# Patient Record
Sex: Female | Born: 1940 | Race: White | Hispanic: No | Marital: Married | Smoking: Never smoker
Health system: Southern US, Community
[De-identification: ages and names within clinical notes are randomized; demographics above are authoritative.]

## PROBLEM LIST (undated history)

## (undated) DIAGNOSIS — Z807 Family history of other malignant neoplasms of lymphoid, hematopoietic and related tissues: Secondary | ICD-10-CM

## (undated) DIAGNOSIS — Z800 Family history of malignant neoplasm of digestive organs: Secondary | ICD-10-CM

## (undated) DIAGNOSIS — Z808 Family history of malignant neoplasm of other organs or systems: Secondary | ICD-10-CM

## (undated) DIAGNOSIS — N6489 Other specified disorders of breast: Secondary | ICD-10-CM

## (undated) DIAGNOSIS — E7800 Pure hypercholesterolemia, unspecified: Secondary | ICD-10-CM

## (undated) DIAGNOSIS — Z803 Family history of malignant neoplasm of breast: Secondary | ICD-10-CM

## (undated) HISTORY — DX: Family history of malignant neoplasm of breast: Z80.3

## (undated) HISTORY — DX: Family history of other malignant neoplasms of lymphoid, hematopoietic and related tissues: Z80.7

## (undated) HISTORY — DX: Pure hypercholesterolemia, unspecified: E78.00

## (undated) HISTORY — PX: APPENDECTOMY: SHX54

## (undated) HISTORY — DX: Family history of malignant neoplasm of other organs or systems: Z80.8

## (undated) HISTORY — DX: Family history of malignant neoplasm of digestive organs: Z80.0

---

## 1997-11-25 ENCOUNTER — Encounter: Payer: Self-pay | Admitting: Gastroenterology

## 1997-11-25 ENCOUNTER — Ambulatory Visit (HOSPITAL_COMMUNITY): Admission: RE | Admit: 1997-11-25 | Discharge: 1997-11-25 | Payer: Self-pay | Admitting: Gastroenterology

## 1998-11-14 ENCOUNTER — Ambulatory Visit (HOSPITAL_COMMUNITY): Admission: RE | Admit: 1998-11-14 | Discharge: 1998-11-14 | Payer: Self-pay | Admitting: Gastroenterology

## 2001-12-07 ENCOUNTER — Other Ambulatory Visit: Admission: RE | Admit: 2001-12-07 | Discharge: 2001-12-07 | Payer: Self-pay | Admitting: Gynecology

## 2003-02-26 HISTORY — PX: COLON SURGERY: SHX602

## 2003-09-14 ENCOUNTER — Ambulatory Visit (HOSPITAL_COMMUNITY): Admission: RE | Admit: 2003-09-14 | Discharge: 2003-09-14 | Payer: Self-pay | Admitting: Gastroenterology

## 2003-10-13 ENCOUNTER — Encounter: Admission: RE | Admit: 2003-10-13 | Discharge: 2003-10-27 | Payer: Self-pay | Admitting: Surgery

## 2003-10-20 ENCOUNTER — Inpatient Hospital Stay (HOSPITAL_COMMUNITY): Admission: RE | Admit: 2003-10-20 | Discharge: 2003-10-27 | Payer: Self-pay | Admitting: Surgery

## 2008-11-24 ENCOUNTER — Ambulatory Visit: Payer: Self-pay | Admitting: Ophthalmology

## 2008-12-07 ENCOUNTER — Ambulatory Visit: Payer: Self-pay | Admitting: Ophthalmology

## 2009-06-20 ENCOUNTER — Ambulatory Visit: Payer: Self-pay | Admitting: General Practice

## 2011-09-02 ENCOUNTER — Ambulatory Visit: Payer: Self-pay | Admitting: Internal Medicine

## 2012-09-02 ENCOUNTER — Ambulatory Visit: Payer: Self-pay | Admitting: Internal Medicine

## 2013-09-27 ENCOUNTER — Ambulatory Visit: Payer: Self-pay | Admitting: Internal Medicine

## 2015-06-14 ENCOUNTER — Other Ambulatory Visit: Payer: Self-pay | Admitting: Internal Medicine

## 2015-06-14 DIAGNOSIS — Z1231 Encounter for screening mammogram for malignant neoplasm of breast: Secondary | ICD-10-CM

## 2015-06-23 ENCOUNTER — Ambulatory Visit
Admission: RE | Admit: 2015-06-23 | Discharge: 2015-06-23 | Disposition: A | Discharge status: Home / Self Care | Payer: Medicare Other | Source: Ambulatory Visit | Attending: Internal Medicine | Admitting: Internal Medicine

## 2015-06-23 ENCOUNTER — Other Ambulatory Visit: Payer: Self-pay | Admitting: Internal Medicine

## 2015-06-23 DIAGNOSIS — Z1231 Encounter for screening mammogram for malignant neoplasm of breast: Secondary | ICD-10-CM | POA: Insufficient documentation

## 2016-06-24 ENCOUNTER — Other Ambulatory Visit: Payer: Self-pay | Admitting: Internal Medicine

## 2016-06-24 DIAGNOSIS — Z1231 Encounter for screening mammogram for malignant neoplasm of breast: Secondary | ICD-10-CM

## 2016-07-09 ENCOUNTER — Ambulatory Visit
Admission: RE | Admit: 2016-07-09 | Discharge: 2016-07-09 | Disposition: A | Discharge status: Home / Self Care | Payer: Medicare Other | Source: Ambulatory Visit | Attending: Internal Medicine | Admitting: Internal Medicine

## 2016-07-09 DIAGNOSIS — Z1231 Encounter for screening mammogram for malignant neoplasm of breast: Secondary | ICD-10-CM | POA: Insufficient documentation

## 2017-06-26 ENCOUNTER — Other Ambulatory Visit: Payer: Self-pay | Admitting: Internal Medicine

## 2017-06-26 DIAGNOSIS — Z1231 Encounter for screening mammogram for malignant neoplasm of breast: Secondary | ICD-10-CM

## 2017-07-23 ENCOUNTER — Ambulatory Visit
Admission: RE | Admit: 2017-07-23 | Discharge: 2017-07-23 | Disposition: A | Discharge status: Home / Self Care | Payer: Medicare Other | Source: Ambulatory Visit | Attending: Internal Medicine | Admitting: Internal Medicine

## 2017-07-23 DIAGNOSIS — Z1231 Encounter for screening mammogram for malignant neoplasm of breast: Secondary | ICD-10-CM | POA: Insufficient documentation

## 2018-09-28 ENCOUNTER — Other Ambulatory Visit: Payer: Self-pay | Admitting: Internal Medicine

## 2018-10-20 ENCOUNTER — Other Ambulatory Visit: Payer: Self-pay | Admitting: Internal Medicine

## 2018-10-20 DIAGNOSIS — Z1231 Encounter for screening mammogram for malignant neoplasm of breast: Secondary | ICD-10-CM

## 2018-10-21 ENCOUNTER — Ambulatory Visit
Admission: RE | Admit: 2018-10-21 | Discharge: 2018-10-21 | Disposition: A | Discharge status: Home / Self Care | Payer: Medicare Other | Source: Ambulatory Visit | Attending: Internal Medicine | Admitting: Internal Medicine

## 2018-10-21 DIAGNOSIS — Z1231 Encounter for screening mammogram for malignant neoplasm of breast: Secondary | ICD-10-CM

## 2018-10-26 ENCOUNTER — Other Ambulatory Visit: Payer: Self-pay | Admitting: Internal Medicine

## 2018-10-26 DIAGNOSIS — R928 Other abnormal and inconclusive findings on diagnostic imaging of breast: Secondary | ICD-10-CM

## 2018-11-05 ENCOUNTER — Ambulatory Visit
Admission: RE | Admit: 2018-11-05 | Discharge: 2018-11-05 | Disposition: A | Discharge status: Home / Self Care | Payer: Medicare Other | Source: Ambulatory Visit | Attending: Internal Medicine | Admitting: Internal Medicine

## 2018-11-05 DIAGNOSIS — R928 Other abnormal and inconclusive findings on diagnostic imaging of breast: Secondary | ICD-10-CM | POA: Insufficient documentation

## 2018-11-06 ENCOUNTER — Other Ambulatory Visit: Payer: Self-pay | Admitting: Internal Medicine

## 2018-11-06 DIAGNOSIS — R928 Other abnormal and inconclusive findings on diagnostic imaging of breast: Secondary | ICD-10-CM

## 2018-11-12 ENCOUNTER — Ambulatory Visit: Payer: Medicare Other

## 2018-11-17 ENCOUNTER — Ambulatory Visit
Admission: RE | Admit: 2018-11-17 | Discharge: 2018-11-17 | Disposition: A | Discharge status: Home / Self Care | Payer: Medicare Other | Source: Ambulatory Visit | Attending: Internal Medicine | Admitting: Internal Medicine

## 2018-11-17 DIAGNOSIS — R928 Other abnormal and inconclusive findings on diagnostic imaging of breast: Secondary | ICD-10-CM | POA: Insufficient documentation

## 2018-11-17 HISTORY — PX: BREAST BIOPSY: SHX20

## 2018-11-18 LAB — SURGICAL PATHOLOGY

## 2018-12-04 ENCOUNTER — Other Ambulatory Visit: Payer: Self-pay | Admitting: General Surgery

## 2018-12-04 DIAGNOSIS — N6489 Other specified disorders of breast: Secondary | ICD-10-CM

## 2018-12-08 ENCOUNTER — Other Ambulatory Visit: Payer: Self-pay | Admitting: General Surgery

## 2018-12-08 DIAGNOSIS — N6489 Other specified disorders of breast: Secondary | ICD-10-CM

## 2018-12-29 ENCOUNTER — Encounter (HOSPITAL_BASED_OUTPATIENT_CLINIC_OR_DEPARTMENT_OTHER): Payer: Self-pay | Admitting: *Deleted

## 2018-12-29 ENCOUNTER — Other Ambulatory Visit: Payer: Self-pay

## 2019-01-01 ENCOUNTER — Other Ambulatory Visit (HOSPITAL_COMMUNITY)
Admission: RE | Admit: 2019-01-01 | Discharge: 2019-01-01 | Disposition: A | Discharge status: Home / Self Care | Payer: Medicare Other | Source: Ambulatory Visit | Attending: General Surgery | Admitting: General Surgery

## 2019-01-01 ENCOUNTER — Other Ambulatory Visit (HOSPITAL_COMMUNITY): Payer: Medicare Other

## 2019-01-01 DIAGNOSIS — Z01812 Encounter for preprocedural laboratory examination: Secondary | ICD-10-CM | POA: Insufficient documentation

## 2019-01-01 DIAGNOSIS — Z20828 Contact with and (suspected) exposure to other viral communicable diseases: Secondary | ICD-10-CM | POA: Insufficient documentation

## 2019-01-01 LAB — SARS CORONAVIRUS 2 (TAT 6-24 HRS): SARS Coronavirus 2: NEGATIVE

## 2019-01-01 NOTE — Progress Notes (Signed)
      Enhanced Recovery after Surgery for Orthopedics Enhanced Recovery after Surgery is a protocol used to improve the stress on your body and your recovery after surgery.  Patient Instructions consume at [place]  . The night before surgery:  o No food after midnight. ONLY clear liquids after midnight  . The day of surgery (if you do NOT have diabetes):  o Drink ONE (1) Pre-Surgery Clear Ensure as directed.   o This drink was given to you during your hospital  pre-op appointment visit. o The pre-op nurse will instruct you on the time to drink the  Pre-Surgery Ensure depending on your surgery time. o Finish the drink at the designated time by the pre-op nurse.  o Nothing else to drink after completing the  Pre-Surgery Clear Ensure.  . The day of surgery (if you have diabetes): o Drink ONE (1) Gatorade 2 (G2) as directed. o This drink was given to you during your hospital  pre-op appointment visit.  o The pre-op nurse will instruct you on the time to drink the   Gatorade 2 (G2) depending on your surgery time. o Color of the Gatorade may vary. Red is not allowed. o Nothing else to drink after completing the  Gatorade 2 (G2).         If you have questions, please contact your surgeon's office.

## 2019-01-04 ENCOUNTER — Other Ambulatory Visit: Payer: Self-pay

## 2019-01-04 ENCOUNTER — Ambulatory Visit
Admission: RE | Admit: 2019-01-04 | Discharge: 2019-01-04 | Disposition: A | Discharge status: Home / Self Care | Payer: Medicare Other | Source: Ambulatory Visit | Attending: General Surgery | Admitting: General Surgery

## 2019-01-04 DIAGNOSIS — N6489 Other specified disorders of breast: Secondary | ICD-10-CM

## 2019-01-04 HISTORY — PX: BREAST LUMPECTOMY: SHX2

## 2019-01-05 ENCOUNTER — Ambulatory Visit (HOSPITAL_BASED_OUTPATIENT_CLINIC_OR_DEPARTMENT_OTHER): Payer: Medicare Other | Admitting: Certified Registered"

## 2019-01-05 ENCOUNTER — Ambulatory Visit
Admission: RE | Admit: 2019-01-05 | Discharge: 2019-01-05 | Disposition: A | Discharge status: Home / Self Care | Payer: Medicare Other | Source: Ambulatory Visit | Attending: General Surgery | Admitting: General Surgery

## 2019-01-05 ENCOUNTER — Ambulatory Visit (HOSPITAL_BASED_OUTPATIENT_CLINIC_OR_DEPARTMENT_OTHER)
Admission: RE | Admit: 2019-01-05 | Discharge: 2019-01-05 | Disposition: A | Discharge status: Home / Self Care | Payer: Medicare Other | Attending: General Surgery | Admitting: General Surgery

## 2019-01-05 ENCOUNTER — Encounter (HOSPITAL_BASED_OUTPATIENT_CLINIC_OR_DEPARTMENT_OTHER): Payer: Self-pay | Admitting: *Deleted

## 2019-01-05 ENCOUNTER — Other Ambulatory Visit: Payer: Self-pay

## 2019-01-05 ENCOUNTER — Encounter (HOSPITAL_BASED_OUTPATIENT_CLINIC_OR_DEPARTMENT_OTHER)
Admission: RE | Disposition: A | Discharge status: Home / Self Care | Payer: Self-pay | Source: Home / Self Care | Attending: General Surgery

## 2019-01-05 DIAGNOSIS — C50411 Malignant neoplasm of upper-outer quadrant of right female breast: Secondary | ICD-10-CM | POA: Diagnosis not present

## 2019-01-05 DIAGNOSIS — Z803 Family history of malignant neoplasm of breast: Secondary | ICD-10-CM | POA: Insufficient documentation

## 2019-01-05 DIAGNOSIS — N6489 Other specified disorders of breast: Secondary | ICD-10-CM

## 2019-01-05 DIAGNOSIS — Z170 Estrogen receptor positive status [ER+]: Secondary | ICD-10-CM | POA: Diagnosis not present

## 2019-01-05 DIAGNOSIS — F419 Anxiety disorder, unspecified: Secondary | ICD-10-CM | POA: Insufficient documentation

## 2019-01-05 DIAGNOSIS — N6310 Unspecified lump in the right breast, unspecified quadrant: Secondary | ICD-10-CM | POA: Diagnosis present

## 2019-01-05 HISTORY — DX: Other specified disorders of breast: N64.89

## 2019-01-05 HISTORY — PX: RADIOACTIVE SEED GUIDED EXCISIONAL BREAST BIOPSY: SHX6490

## 2019-01-05 SURGERY — RADIOACTIVE SEED GUIDED BREAST BIOPSY
Anesthesia: General | Site: Breast | Laterality: Right

## 2019-01-05 MED ORDER — GABAPENTIN 100 MG PO CAPS
100.0000 mg | ORAL_CAPSULE | ORAL | Status: AC
  Administered 2019-01-05: 100 mg via ORAL

## 2019-01-05 MED ORDER — ACETAMINOPHEN 500 MG PO TABS
ORAL_TABLET | ORAL | Status: AC
  Filled 2019-01-05: qty 2

## 2019-01-05 MED ORDER — BUPIVACAINE HCL (PF) 0.25 % IJ SOLN
INTRAMUSCULAR | Status: DC | PRN
  Administered 2019-01-05: 10 mL

## 2019-01-05 MED ORDER — GABAPENTIN 100 MG PO CAPS
ORAL_CAPSULE | ORAL | Status: AC
  Filled 2019-01-05: qty 1

## 2019-01-05 MED ORDER — FENTANYL CITRATE (PF) 100 MCG/2ML IJ SOLN
INTRAMUSCULAR | Status: AC
  Filled 2019-01-05: qty 2

## 2019-01-05 MED ORDER — CEFAZOLIN SODIUM-DEXTROSE 2-4 GM/100ML-% IV SOLN: 2.0000 g | INTRAVENOUS | Status: DC

## 2019-01-05 MED ORDER — ONDANSETRON HCL 4 MG/2ML IJ SOLN
INTRAMUSCULAR | Status: AC
  Filled 2019-01-05: qty 2

## 2019-01-05 MED ORDER — FENTANYL CITRATE (PF) 100 MCG/2ML IJ SOLN
50.0000 ug | INTRAMUSCULAR | Status: DC | PRN
  Administered 2019-01-05: 50 ug via INTRAVENOUS

## 2019-01-05 MED ORDER — DEXAMETHASONE SODIUM PHOSPHATE 10 MG/ML IJ SOLN
INTRAMUSCULAR | Status: AC
  Filled 2019-01-05: qty 1

## 2019-01-05 MED ORDER — ACETAMINOPHEN-CODEINE #3 300-30 MG PO TABS: 1.0000 | ORAL_TABLET | Freq: Four times a day (QID) | ORAL | 0 refills | Status: DC | PRN

## 2019-01-05 MED ORDER — ENSURE PRE-SURGERY PO LIQD: 296.0000 mL | Freq: Once | ORAL | Status: DC

## 2019-01-05 MED ORDER — MIDAZOLAM HCL 2 MG/2ML IJ SOLN: 1.0000 mg | INTRAMUSCULAR | Status: DC | PRN

## 2019-01-05 MED ORDER — ONDANSETRON HCL 4 MG/2ML IJ SOLN
INTRAMUSCULAR | Status: DC | PRN
  Administered 2019-01-05: 4 mg via INTRAVENOUS

## 2019-01-05 MED ORDER — LIDOCAINE 2% (20 MG/ML) 5 ML SYRINGE
INTRAMUSCULAR | Status: AC
  Filled 2019-01-05: qty 5

## 2019-01-05 MED ORDER — FENTANYL CITRATE (PF) 100 MCG/2ML IJ SOLN
25.0000 ug | INTRAMUSCULAR | Status: DC | PRN
  Administered 2019-01-05: 25 ug via INTRAVENOUS

## 2019-01-05 MED ORDER — ACETAMINOPHEN 500 MG PO TABS
1000.0000 mg | ORAL_TABLET | ORAL | Status: AC
  Administered 2019-01-05: 1000 mg via ORAL

## 2019-01-05 MED ORDER — PROPOFOL 10 MG/ML IV BOLUS
INTRAVENOUS | Status: DC | PRN
  Administered 2019-01-05: 60 mg via INTRAVENOUS

## 2019-01-05 MED ORDER — LACTATED RINGERS IV SOLN
INTRAVENOUS | Status: DC
  Administered 2019-01-05: 10:00:00 via INTRAVENOUS

## 2019-01-05 MED ORDER — CEFAZOLIN SODIUM-DEXTROSE 2-3 GM-%(50ML) IV SOLR
INTRAVENOUS | Status: DC | PRN
  Administered 2019-01-05: 2 g via INTRAVENOUS

## 2019-01-05 MED ORDER — CEFAZOLIN SODIUM-DEXTROSE 2-4 GM/100ML-% IV SOLN
INTRAVENOUS | Status: AC
  Filled 2019-01-05: qty 100

## 2019-01-05 MED ORDER — DEXAMETHASONE SODIUM PHOSPHATE 10 MG/ML IJ SOLN
INTRAMUSCULAR | Status: DC | PRN
  Administered 2019-01-05: 5 mg via INTRAVENOUS

## 2019-01-05 MED ORDER — PROPOFOL 10 MG/ML IV BOLUS
INTRAVENOUS | Status: AC
  Filled 2019-01-05: qty 20

## 2019-01-05 SURGICAL SUPPLY — 59 items
ADH SKN CLS APL DERMABOND .7 (GAUZE/BANDAGES/DRESSINGS) ×1
APL PRP STRL LF DISP 70% ISPRP (MISCELLANEOUS) ×1
APPLIER CLIP 9.375 MED OPEN (MISCELLANEOUS)
APR CLP MED 9.3 20 MLT OPN (MISCELLANEOUS)
BINDER BREAST MEDIUM (GAUZE/BANDAGES/DRESSINGS) ×2 IMPLANT
BLADE SURG 15 STRL LF DISP TIS (BLADE) ×1 IMPLANT
BLADE SURG 15 STRL SS (BLADE) ×3
CANISTER SUC SOCK COL 7IN (MISCELLANEOUS) IMPLANT
CANISTER SUCT 1200ML W/VALVE (MISCELLANEOUS) IMPLANT
CHLORAPREP W/TINT 26 (MISCELLANEOUS) ×3 IMPLANT
CLIP APPLIE 9.375 MED OPEN (MISCELLANEOUS) IMPLANT
CLIP VESOCCLUDE SM WIDE 6/CT (CLIP) IMPLANT
CLOSURE WOUND 1/2 X4 (GAUZE/BANDAGES/DRESSINGS) ×1
COVER BACK TABLE REUSABLE LG (DRAPES) ×3 IMPLANT
COVER MAYO STAND REUSABLE (DRAPES) ×3 IMPLANT
COVER PROBE W GEL 5X96 (DRAPES) ×3 IMPLANT
COVER WAND RF STERILE (DRAPES) IMPLANT
DECANTER SPIKE VIAL GLASS SM (MISCELLANEOUS) IMPLANT
DERMABOND ADVANCED (GAUZE/BANDAGES/DRESSINGS) ×2
DERMABOND ADVANCED .7 DNX12 (GAUZE/BANDAGES/DRESSINGS) ×1 IMPLANT
DRAPE LAPAROSCOPIC ABDOMINAL (DRAPES) ×3 IMPLANT
DRAPE UTILITY XL STRL (DRAPES) ×3 IMPLANT
ELECT COATED BLADE 2.86 ST (ELECTRODE) ×3 IMPLANT
ELECT REM PT RETURN 9FT ADLT (ELECTROSURGICAL) ×3
ELECTRODE REM PT RTRN 9FT ADLT (ELECTROSURGICAL) ×1 IMPLANT
GAUZE SPONGE 4X4 12PLY STRL LF (GAUZE/BANDAGES/DRESSINGS) IMPLANT
GLOVE BIO SURGEON STRL SZ7 (GLOVE) ×6 IMPLANT
GLOVE BIOGEL PI IND STRL 6.5 (GLOVE) IMPLANT
GLOVE BIOGEL PI IND STRL 7.5 (GLOVE) ×1 IMPLANT
GLOVE BIOGEL PI INDICATOR 6.5 (GLOVE) ×2
GLOVE BIOGEL PI INDICATOR 7.5 (GLOVE) ×2
GLOVE ECLIPSE 6.5 STRL STRAW (GLOVE) ×2 IMPLANT
GOWN STRL REUS W/ TWL LRG LVL3 (GOWN DISPOSABLE) ×2 IMPLANT
GOWN STRL REUS W/TWL LRG LVL3 (GOWN DISPOSABLE) ×6
HEMOSTAT ARISTA ABSORB 3G PWDR (HEMOSTASIS) IMPLANT
ILLUMINATOR WAVEGUIDE N/F (MISCELLANEOUS) IMPLANT
KIT MARKER MARGIN INK (KITS) ×3 IMPLANT
LIGHT WAVEGUIDE WIDE FLAT (MISCELLANEOUS) IMPLANT
NDL HYPO 25X1 1.5 SAFETY (NEEDLE) ×1 IMPLANT
NEEDLE HYPO 25X1 1.5 SAFETY (NEEDLE) ×3 IMPLANT
NS IRRIG 1000ML POUR BTL (IV SOLUTION) ×2 IMPLANT
PACK BASIN DAY SURGERY FS (CUSTOM PROCEDURE TRAY) ×3 IMPLANT
PENCIL SMOKE EVACUATOR (MISCELLANEOUS) ×3 IMPLANT
SLEEVE SCD COMPRESS KNEE MED (MISCELLANEOUS) ×3 IMPLANT
SPONGE LAP 4X18 RFD (DISPOSABLE) ×3 IMPLANT
STRIP CLOSURE SKIN 1/2X4 (GAUZE/BANDAGES/DRESSINGS) ×2 IMPLANT
SUT MNCRL AB 4-0 PS2 18 (SUTURE) IMPLANT
SUT MON AB 5-0 PS2 18 (SUTURE) ×2 IMPLANT
SUT SILK 2 0 SH (SUTURE) IMPLANT
SUT VIC AB 2-0 SH 27 (SUTURE) ×3
SUT VIC AB 2-0 SH 27XBRD (SUTURE) ×1 IMPLANT
SUT VIC AB 3-0 SH 27 (SUTURE) ×3
SUT VIC AB 3-0 SH 27X BRD (SUTURE) ×1 IMPLANT
SYR CONTROL 10ML LL (SYRINGE) ×3 IMPLANT
TOWEL GREEN STERILE FF (TOWEL DISPOSABLE) ×3 IMPLANT
TRAY FAXITRON CT DISP (TRAY / TRAY PROCEDURE) ×3 IMPLANT
TUBE CONNECTING 20'X1/4 (TUBING)
TUBE CONNECTING 20X1/4 (TUBING) IMPLANT
YANKAUER SUCT BULB TIP NO VENT (SUCTIONS) IMPLANT

## 2019-01-05 NOTE — Anesthesia Preprocedure Evaluation (Addendum)
Anesthesia Evaluation  Patient identified by MRN, date of birth, ID band Patient awake    Reviewed: Allergy & Precautions, NPO status , Patient's Chart, lab work & pertinent test results  Airway Mallampati: I  TM Distance: >3 FB Neck ROM: Full    Dental no notable dental hx. (+) Teeth Intact, Dental Advisory Given   Pulmonary neg pulmonary ROS,    Pulmonary exam normal breath sounds clear to auscultation       Cardiovascular negative cardio ROS Normal cardiovascular exam Rhythm:Regular Rate:Normal     Neuro/Psych PSYCHIATRIC DISORDERS Anxiety negative neurological ROS     GI/Hepatic negative GI ROS, Neg liver ROS,   Endo/Other  negative endocrine ROS  Renal/GU negative Renal ROS  negative genitourinary   Musculoskeletal negative musculoskeletal ROS (+)   Abdominal   Peds  Hematology negative hematology ROS (+)   Anesthesia Other Findings   Reproductive/Obstetrics                            Anesthesia Physical Anesthesia Plan  ASA: II  Anesthesia Plan: General   Post-op Pain Management:    Induction: Intravenous  PONV Risk Score and Plan: 3 and Ondansetron, Dexamethasone and Treatment may vary due to age or medical condition  Airway Management Planned: LMA  Additional Equipment:   Intra-op Plan:   Post-operative Plan: Extubation in OR  Informed Consent: I have reviewed the patients History and Physical, chart, labs and discussed the procedure including the risks, benefits and alternatives for the proposed anesthesia with the patient or authorized representative who has indicated his/her understanding and acceptance.     Dental advisory given  Plan Discussed with: CRNA  Anesthesia Plan Comments:         Anesthesia Quick Evaluation

## 2019-01-05 NOTE — Transfer of Care (Signed)
Immediate Anesthesia Transfer of Care Note  Patient: Sheri Shaw  Procedure(s) Performed: RADIOACTIVE SEED GUIDED EXCISIONAL RIGHT BREAST BIOPSY (Right Breast)  Patient Location: PACU  Anesthesia Type:General  Level of Consciousness: sedated  Airway & Oxygen Therapy: Patient Spontanous Breathing and Patient connected to face mask oxygen  Post-op Assessment: Report given to RN and Post -op Vital signs reviewed and stable  Post vital signs: Reviewed and stable  Last Vitals:  Vitals Value Taken Time  BP 165/71 01/05/19 1110  Temp    Pulse 61 01/05/19 1112  Resp 14 01/05/19 1112  SpO2 98 % 01/05/19 1112  Vitals shown include unvalidated device data.  Last Pain:  Vitals:   01/05/19 1004  TempSrc: Oral  PainSc: 0-No pain      Patients Stated Pain Goal: 3 ([id number])  Complications: No apparent anesthesia complications

## 2019-01-05 NOTE — H&P (Signed)
78 yof referred by Dr Owens Shark for new right breast mass. she has no prior breast history. fh of an aunt with breast cancer in her 20s. she had a screening mm that shows c density breasts. she had a ruoq distortion. there was no sonographic correlate. this underwent a biopsy that showed a small csl. the clip migrated 1.8 cm away. she is here to discuss options. her son is Dr Barnett Applebaum in [place]   Past Surgical History Appendectomy  Breast Biopsy  Right. Cataract Surgery  Bilateral. Colon Polyp Removal - Colonoscopy  Colon Removal - Partial   Diagnostic Studies History Colonoscopy  >10 years ago Mammogram  within last year Pap Smear  >5 years ago  Allergies  No Known Allergies No Known Drug Allergies Allergies Reconciled   Medication History  diazePAM (5MG  Tablet, Oral) Active. Vital-D Rx (1MG  Tablet, Oral) Active. Medications Reconciled  Social History Caffeine use  Tea. No alcohol use  No drug use  Tobacco use  Never smoker.  Family History  Breast Cancer  Family Members In General.  Pregnancy / Birth History  Age at menarche  17 years. Age of menopause  52-55 Contraceptive History  Oral contraceptives. Gravida  2 Maternal age  64-25 Para  2  Other Problems  Gastric Ulcer  Hypercholesterolemia    Review of Systems General Not Present- Appetite Loss, Chills, Fatigue, Fever, Night Sweats, Weight Gain and Weight Loss. Skin Not Present- Change in Wart/Mole, Dryness, Hives, Jaundice, New Lesions, Non-Healing Wounds, Rash and Ulcer. HEENT Not Present- Earache, Hearing Loss, Hoarseness, Nose Bleed, Oral Ulcers, Ringing in the Ears, Seasonal Allergies, Sinus Pain, Sore Throat, Visual Disturbances, Wears glasses/contact lenses and Yellow Eyes. Respiratory Not Present- Bloody sputum, Chronic Cough, Difficulty Breathing, Snoring and Wheezing. Breast Not Present- Breast Mass, Breast Pain, Nipple Discharge and Skin Changes. Cardiovascular  Not Present- Chest Pain, Difficulty Breathing Lying Down, Leg Cramps, Palpitations, Rapid Heart Rate, Shortness of Breath and Swelling of Extremities. Gastrointestinal Not Present- Abdominal Pain, Bloating, Bloody Stool, Change in Bowel Habits, Chronic diarrhea, Constipation, Difficulty Swallowing, Excessive gas, Gets full quickly at meals, Hemorrhoids, Indigestion, Nausea, Rectal Pain and Vomiting. Female Genitourinary Not Present- Frequency, Nocturia, Painful Urination, Pelvic Pain and Urgency. Musculoskeletal Not Present- Back Pain, Joint Pain, Joint Stiffness, Muscle Pain, Muscle Weakness and Swelling of Extremities. Neurological Not Present- Decreased Memory, Fainting, Headaches, Numbness, Seizures, Tingling, Tremor, Trouble walking and Weakness. Psychiatric Not Present- Anxiety, Bipolar, Change in Sleep Pattern, Depression, Fearful and Frequent crying. Endocrine Not Present- Cold Intolerance, Excessive Hunger, Hair Changes, Heat Intolerance, Hot flashes and New Diabetes. Hematology Not Present- Blood Thinners, Easy Bruising, Excessive bleeding, Gland problems, HIV and Persistent Infections.   Physical Exam  General Mental Status-Alert. Orientation-Oriented X3. Head and Neck Trachea-midline. Eye Sclera/Conjunctiva - Bilateral-No scleral icterus. Chest and Lung Exam Chest and lung exam reveals -quiet, even and easy respiratory effort with no use of accessory muscles. Breast Nipples-No Discharge. Breast Lump-No Palpable Breast Mass. Note: hematoma upper outer quadrant right breast Neurologic Neurologic evaluation reveals -alert and oriented x 3 with no impairment of recent or remote memory. Lymphatic Head & Neck General Head & Neck Lymphatics: Bilateral - Description - Normal. Axillary General Axillary Region: Bilateral - Description - Normal. Note: no Trooper adenopathy   Assessment & Plan  ABNORMAL MAMMOGRAM OF RIGHT BREAST (R92.8) Story: Right breast seed guided  excisional biopsy We discussed observation vs surgery. discussed low change of upgrade on surgical excision. discussed seed guided excision with risks, recovery. she would like to  proceed with surgery. will schedule at least a couple weeks out to let her hematoma resolve Local data suggests up to 18% upgrade rate to atypia or dcis with excision. discussed distortion is hard to follow for changes as well

## 2019-01-05 NOTE — Discharge Instructions (Signed)
[facility] °Office Phone Number [phone] °POST OP INSTRUCTIONS °Take 400 mg of ibuprofen every 8 hours or 650 mg tylenol every 6 hours for next 72 hours then as needed. Use ice several times daily also. °Always review your discharge instruction sheet given to you by the facility where your surgery was performed. ° °IF YOU HAVE DISABILITY OR FAMILY LEAVE FORMS, YOU MUST BRING THEM TO THE OFFICE FOR PROCESSING.  DO NOT GIVE THEM TO YOUR DOCTOR. ° °1. A prescription for pain medication may be given to you upon discharge.  Take your pain medication as prescribed, if needed.  If narcotic pain medicine is not needed, then you may take acetaminophen (Tylenol), naprosyn (Alleve) or ibuprofen (Advil) as needed. °2. Take your usually prescribed medications unless otherwise directed °3. If you need a refill on your pain medication, please contact your pharmacy.  They will contact our office to request authorization.  Prescriptions will not be filled after 5pm or on week-ends. °4. You should eat very light the first 24 hours after surgery, such as soup, crackers, pudding, etc.  Resume your normal diet the day after surgery. °5. Most patients will experience some swelling and bruising in the breast.  Ice packs and a good support bra will help.  Wear the breast binder provided or a sports bra for 72 hours day and night.  After that wear a sports bra during the day until you return to the office. Swelling and bruising can take several days to resolve.  °6. It is common to experience some constipation if taking pain medication after surgery.  Increasing fluid intake and taking a stool softener will usually help or prevent this problem from occurring.  A mild laxative (Milk of Magnesia or Miralax) should be taken according to package directions if there are no bowel movements after 48 hours. °7. Unless discharge instructions indicate otherwise, you may remove your bandages 48 hours after surgery and you may  shower at that time.  You may have steri-strips (small skin tapes) in place directly over the incision.  These strips should be left on the skin for 7-10 days and will come off on their own.  If your surgeon used skin glue on the incision, you may shower in 24 hours.  The glue will flake off over the next 2-3 weeks.  Any sutures or staples will be removed at the office during your follow-up visit. °8. ACTIVITIES:  You may resume regular daily activities (gradually increasing) beginning the next day.  Wearing a good support bra or sports bra minimizes pain and swelling.  You may have sexual intercourse when it is comfortable. °a. You may drive when you no longer are taking prescription pain medication, you can comfortably wear a seatbelt, and you can safely maneuver your car and apply brakes. °b. RETURN TO WORK:  ______________________________________________________________________________________ °9. You should see your doctor in the office for a follow-up appointment approximately two weeks after your surgery.  Your doctor’s nurse will typically make your follow-up appointment when she calls you with your pathology report.  Expect your pathology report 3-4 business days after your surgery.  You may call to check if you do not hear from us after three days. °10. OTHER INSTRUCTIONS: _______________________________________________________________________________________________ _____________________________________________________________________________________________________________________________________ °_____________________________________________________________________________________________________________________________________ °_____________________________________________________________________________________________________________________________________ ° °WHEN TO CALL DR WAKEFIELD: °1. Fever over 101.0 °2. Nausea and/or vomiting. °3. Extreme swelling or bruising. °4. Continued bleeding from  incision. °5. Increased pain, redness, or drainage from the incision. ° °The clinic staff is available to answer   your questions during regular business hours.  Please don’t hesitate to call and ask to speak to one of the nurses for clinical concerns.  If you have a medical emergency, go to the nearest emergency room or call 911.  A surgeon from [facility] is always on call at the hospital. ° °For further questions, please visit [email or web address] ° ° ° °Post Anesthesia Home Care Instructions ° °Activity: °Get plenty of rest for the remainder of the day. A responsible individual must stay with you for 24 hours following the procedure.  °For the next 24 hours, DO NOT: °-Drive a car °-Operate machinery °-Drink alcoholic beverages °-Take any medication unless instructed by your physician °-Make any legal decisions or sign important papers. ° °Meals: °Start with liquid foods such as gelatin or soup. Progress to regular foods as tolerated. Avoid greasy, spicy, heavy foods. If nausea and/or vomiting occur, drink only clear liquids until the nausea and/or vomiting subsides. Call your physician if vomiting continues. ° °Special Instructions/Symptoms: °Your throat may feel dry or sore from the anesthesia or the breathing tube placed in your throat during surgery. If this causes discomfort, gargle with warm salt water. The discomfort should disappear within 24 hours. ° °If you had a scopolamine patch placed behind your ear for the management of post- operative nausea and/or vomiting: ° °1. The medication in the patch is effective for 72 hours, after which it should be removed.  Wrap patch in a tissue and discard in the trash. Wash hands thoroughly with soap and water. °2. You may remove the patch earlier than 72 hours if you experience unpleasant side effects which may include dry mouth, dizziness or visual disturbances. °3. Avoid touching the patch. Wash your hands with soap and water after contact  with the patch. ° ° °Call your surgeon if you experience:  ° °1.  Fever over 101.0. °2.  Inability to urinate. °3.  Nausea and/or vomiting. °4.  Extreme swelling or bruising at the surgical site. °5.  Continued bleeding from the incision. °6.  Increased pain, redness or drainage from the incision. °7.  Problems related to your pain medication. °8.  Any problems and/or concerns °   ° ° °

## 2019-01-05 NOTE — Anesthesia Postprocedure Evaluation (Signed)
Anesthesia Post Note  Patient: Sheri Shaw  Procedure(s) Performed: RADIOACTIVE SEED GUIDED EXCISIONAL RIGHT BREAST BIOPSY (Right Breast)     Patient location during evaluation: PACU Anesthesia Type: General Level of consciousness: awake and alert Pain management: pain level controlled Vital Signs Assessment: post-procedure vital signs reviewed and stable Respiratory status: spontaneous breathing, nonlabored ventilation, respiratory function stable and patient connected to nasal cannula oxygen Cardiovascular status: blood pressure returned to baseline and stable Postop Assessment: no apparent nausea or vomiting Anesthetic complications: no    Last Vitals:  Vitals:   01/05/19 1216 01/05/19 1320  BP:  (!) 170/65  Pulse: (!) 58 63  Resp: 19 20  Temp:  36.6 C  SpO2: 100% 100%    Last Pain:  Vitals:   01/05/19 1320  TempSrc: Oral  PainSc: 3                  Sheri Shaw Sheri Shaw

## 2019-01-05 NOTE — Anesthesia Procedure Notes (Signed)
Procedure Name: LMA Insertion Performed by: Verita Lamb, CRNA Pre-anesthesia Checklist: Patient identified, Emergency Drugs available, Suction available and Patient being monitored Patient Re-evaluated:Patient Re-evaluated prior to induction Oxygen Delivery Method: Circle system utilized Preoxygenation: Pre-oxygenation with 100% oxygen Induction Type: IV induction Ventilation: Mask ventilation without difficulty LMA: LMA inserted LMA Size: 3.0 Number of attempts: 1 Placement Confirmation: positive ETCO2 Tube secured with: Tape Dental Injury: Teeth and Oropharynx as per pre-operative assessment

## 2019-01-05 NOTE — Op Note (Signed)
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 Serita Grammes Anesthesia: General Complications: None Drains: None Specimens: Right breast tissue marked with paint Sponge and count was correct at completion Disposition to recovery in stable condition  Indications: This is 78 year old female who underwent a screening mammogram that showed a right upper outer quadrant distortion.  There was no sonographic correlate.  This underwent a biopsy that showed a complex sclerosing lesion.  She was referred to discuss options and she would like this area removed.  She had a seed placed and I discussed with the radiologist because the clip had migrated 1.8 cm away.  The seed was in the posterior portion of the distortion.  Procedure: After informed consent was obtained the patient was taken to the operating room.  She was given antibiotics.  SCDs were in place.  She was placed under general anesthesia without complication.  She was prepped and draped in the standard sterile surgical fashion.  A surgical timeout was then performed.  I located the seed in the right upper outer quadrant.  I then infiltrated Marcaine and made a periareolar incision in order to hide the scar later.  I then tunneled to the radioactive seed.  I remove the tissue anterior to the radioactive seed and the seed was at the posterior margin of the excision.  I then confirmed the seed removal with mammography.  The clip was not present but this was not unexpected given its migration.  I then obtained hemostasis.  I closed the breast tissue with 2-0 Vicryl.  The skin was closed with 3-0 Vicryl and 5-0 Monocryl.  Glue and Steri-Strips were applied.  She tolerated this well was extubated and transferred to recovery stable.

## 2019-01-06 ENCOUNTER — Encounter (HOSPITAL_BASED_OUTPATIENT_CLINIC_OR_DEPARTMENT_OTHER): Payer: Self-pay | Admitting: General Surgery

## 2019-01-08 LAB — SURGICAL PATHOLOGY

## 2019-01-20 ENCOUNTER — Other Ambulatory Visit: Payer: Self-pay

## 2019-01-25 ENCOUNTER — Encounter: Payer: Self-pay | Admitting: Oncology

## 2019-01-25 ENCOUNTER — Inpatient Hospital Stay: Payer: Medicare Other | Attending: Oncology | Admitting: Oncology

## 2019-01-25 ENCOUNTER — Other Ambulatory Visit: Payer: Self-pay

## 2019-01-25 VITALS — BP 169/87 | HR 77 | Temp 98.3°F | Resp 18 | Ht 67.32 in | Wt 114.4 lb

## 2019-01-25 DIAGNOSIS — C50411 Malignant neoplasm of upper-outer quadrant of right female breast: Secondary | ICD-10-CM | POA: Diagnosis not present

## 2019-01-25 DIAGNOSIS — Z170 Estrogen receptor positive status [ER+]: Secondary | ICD-10-CM | POA: Diagnosis not present

## 2019-01-25 NOTE — Progress Notes (Signed)
New patient evaluation today.

## 2019-01-26 NOTE — Progress Notes (Signed)
Hematology/Oncology Consult note [facility] Telephone:([phone] [phone]   Patient Care Team: Adin Hector, MD as PCP - General (Internal Medicine)  REFERRING PROVIDER: Rolm Bookbinder, MD  CHIEF COMPLAINTS/REASON FOR VISIT:  Evaluation of breast cancer   HISTORY OF PRESENTING ILLNESS:   Sheri Shaw is a  78 y.o.  female with PMH listed below was seen in consultation at the request of  Rolm Bookbinder, MD  for evaluation of breast cancer.  10/21/2018 bilateral screening mammogram showed right breast architectural distortion at approximately 11:00.  No sonographic correlate identified.  11/05/2018 unilateral right diagnostic mammogram confirmed right breast architectural distortion.Targeted ultrasound is performed in the upper outer right breast, showing no discrete cystic or solid mass or area of shadowing to correspond to the mammographic finding of architectural distortion.  Right axillary lymph node status was not commented. 11/17/2018 patient underwent stereotactic core needle biopsy of the right breast distortion.    Biopsy showed small complex sclerosing lesion/radial scar.  Fibrocystic changes and few ductal microcalcifications.  Focal duct ectasia and inflammation.  Negative for atypia or malignancy. Patient was evaluated by Dr. 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  DCIS is present, low-grade.  Calcifications associated with carcinoma.  Margins were uninvolved by carcinoma. Lymphovascular space invasion present. Previous biopsy site changes present. pT1a, pNX ER/PR strongly positive.  95%. Ki-67 5% HER-2 negative  (1+) Patient was referred to oncology for further evaluation and management. Today the patient was accompanied by his son Dr. Barnett Applebaum who is an OB/GYN physician at [facility]  center Patient reports having bruising/soreness around her surgical site.  Denies any other complaints.   Denies any previous use of estrogen replacement therapy. Her maternal aunt had a history of breast cancer.  Maternal uncle history of lung cancer. Patient lives with her husband.    Patient has a history of osteoporosis.  No recent DEXA scan.  Per patient, she previously cannot tolerate Fosamax treatment.  Review of Systems  Constitutional: Negative for appetite change, chills, fatigue and fever.  HENT:   Negative for hearing loss and voice change.   Eyes: Negative for eye problems.  Respiratory: Negative for chest tightness and cough.   Cardiovascular: Negative for chest pain.  Gastrointestinal: Negative for abdominal distention, abdominal pain and blood in stool.  Endocrine: Negative for hot flashes.  Genitourinary: Negative for difficulty urinating and frequency.   Musculoskeletal: Negative for arthralgias.  Skin: Negative for itching and rash.  Neurological: Negative for extremity weakness.  Hematological: Negative for adenopathy.  Psychiatric/Behavioral: Negative for confusion.    MEDICAL HISTORY:  Past Medical History:  Diagnosis Date  . Hypercholesteremia   . Radial scar of right breast     SURGICAL HISTORY: Past Surgical History:  Procedure Laterality Date  . APPENDECTOMY    . BREAST BIOPSY Right 11/17/2018   affirm bx  ribbon marker, path pending  . COLON SURGERY     for diverticulitis  . RADIOACTIVE SEED GUIDED EXCISIONAL BREAST BIOPSY Right 01/05/2019   Procedure: RADIOACTIVE SEED GUIDED EXCISIONAL RIGHT BREAST BIOPSY;  Surgeon: Rolm Bookbinder, MD;  Location: [facility];  Service: General;  Laterality: Right;    SOCIAL HISTORY: Social History   Socioeconomic History  . Marital status: Married    Spouse name: Not on file  . Number of children: Not on file  . Years of education: Not on file  .  Highest education level: Not on file   Occupational History  . Not on file  Social Needs  . Financial resource strain: Not on file  . Food insecurity    Worry: Not on file    Inability: Not on file  . Transportation needs    Medical: Not on file    Non-medical: Not on file  Tobacco Use  . Smoking status: Never Smoker  . Smokeless tobacco: Never Used  Substance and Sexual Activity  . Alcohol use: Never    Frequency: Never  . Drug use: Never  . Sexual activity: Not on file  Lifestyle  . Physical activity    Days per week: Not on file    Minutes per session: Not on file  . Stress: Not on file  Relationships  . Social Herbalist on phone: Not on file    Gets together: Not on file    Attends religious service: Not on file    Active member of club or organization: Not on file    Attends meetings of clubs or organizations: Not on file    Relationship status: Not on file  . Intimate partner violence    Fear of current or ex partner: Not on file    Emotionally abused: Not on file    Physically abused: Not on file    Forced sexual activity: Not on file  Other Topics Concern  . Not on file  Social History Narrative  . Not on file    FAMILY HISTORY: Family History  Problem Relation Age of Onset  . Breast cancer Maternal Aunt   . Cancer - Other Maternal Aunt   . Lung cancer Maternal Uncle     ALLERGIES:  has No Known Allergies.  MEDICATIONS:  Current Outpatient Medications  Medication Sig Dispense Refill  . acetaminophen-codeine (TYLENOL #3) 300-30 MG tablet Take 1 tablet by mouth every 6 (six) hours as needed for moderate pain. 6 tablet 0  . Calcium Citrate-Vitamin D (CALCIUM + D PO) Take by mouth.    . ciprofloxacin (CIPRO) 250 MG tablet Take 250 mg by mouth 2 (two) times daily.    . diazepam (VALIUM) 5 MG tablet Take 5 mg by mouth at bedtime as needed for anxiety.    . Vitamin D, Cholecalciferol, 50 MCG (2000 UT) CAPS Take by mouth.     No current facility-administered medications for this  visit.      PHYSICAL EXAMINATION: ECOG PERFORMANCE STATUS: 0 - Asymptomatic Vitals:   01/25/19 1444  BP: (!) 169/87  Pulse: 77  Resp: 18  Temp: 98.3 F (36.8 C)   Filed Weights   01/25/19 1444  Weight: 114 lb 6.4 oz (51.9 kg)    Physical Exam Constitutional:      General: She is not in acute distress.    Comments: Thin built. Walks independently.   HENT:     Head: Normocephalic and atraumatic.  Eyes:     General: No scleral icterus.    Pupils: Pupils are equal, round, and reactive to light.  Neck:     Musculoskeletal: Normal range of motion and neck supple.  Cardiovascular:     Rate and Rhythm: Normal rate and regular rhythm.     Heart sounds: Normal heart sounds.  Pulmonary:     Effort: Pulmonary effort is normal. No respiratory distress.     Breath sounds: No wheezing.  Abdominal:     General: Bowel sounds are normal. There is no distension.  Palpations: Abdomen is soft. There is no mass.     Tenderness: There is no abdominal tenderness.  Musculoskeletal: Normal range of motion.        General: No deformity.  Skin:    General: Skin is warm and dry.     Findings: No erythema or rash.  Neurological:     Mental Status: She is alert and oriented to person, place, and time.     Cranial Nerves: No cranial nerve deficit.     Coordination: Coordination normal.  Psychiatric:        Behavior: Behavior normal.        Thought Content: Thought content normal.   Breast exam was performed in seated and lying down position. Patient is status post lumpectomy on the right breast, with healing scar. She has some bruising over her right breast.  Mild tenderness with palpation. No palpable axillary lymphadenopathy. No palpable mass in left breast.  No palpable lymphadenopathy of left axillary.    LABORATORY DATA:  I have reviewed the data as listed No results found for: WBC, HGB, HCT, MCV, PLT No results for input(s): NA, K, CL, CO2, GLUCOSE, BUN, CREATININE, CALCIUM,  GFRNONAA, GFRAA, PROT, ALBUMIN, AST, ALT, ALKPHOS, BILITOT, BILIDIR, IBILI in the last 8760 hours. Iron/TIBC/Ferritin/ %Sat No results found for: IRON, TIBC, FERRITIN, IRONPCTSAT    RADIOGRAPHIC STUDIES: I have personally reviewed the radiological images as listed and agreed with the findings in the report.  Mm Breast Surgical Specimen  Result Date: 01/05/2019 CLINICAL DATA:  Assess specimens EXAM: SPECIMEN RADIOGRAPH OF THE RIGHT BREAST COMPARISON:  Previous exam(s). FINDINGS: Status post excision of the right breast. The radioactive seed is present, completely intact, and were marked for pathology. IMPRESSION: Specimen radiograph of the right breast. Electronically Signed   By: Dorise Bullion III M.D   On: 01/05/2019 10:55   Mm Rt Radioactive Seed Loc Mammo Guide  Result Date: 01/04/2019 CLINICAL DATA:  Biopsy-proven complex sclerosing lesion of the RIGHT breast scheduled for surgical excision requiring preoperative radioactive seed localization. Postprocedure mammogram report of 11/17/2018 described the clip at 1.8 cm above the area of distortion in the upper-outer quadrant of the RIGHT breast. EXAM: MAMMOGRAPHIC GUIDED RADIOACTIVE SEED LOCALIZATION OF THE RIGHT BREAST COMPARISON:  Previous exam(s). FINDINGS: Patient presents for radioactive seed localization prior to surgical excision. I met with the patient and we discussed the procedure of seed localization including benefits and alternatives. We discussed the high likelihood of a successful procedure. We discussed the risks of the procedure including infection, bleeding, tissue injury and further surgery. We discussed the low dose of radioactivity involved in the procedure. Informed, written consent was given. The usual time-out protocol was performed immediately prior to the procedure. Using mammographic guidance, sterile technique, 1% lidocaine and an I-125 radioactive seed, the area of architectural distortion in the upper RIGHT breast was  localized using a superior approach. The follow-up mammogram images confirm the seed in the expected location and were marked for Dr. Donne Hazel. Follow-up survey of the patient confirms presence of the radioactive seed. Order number of I-125 seed:  [id number]. Total activity:  3.329 millicurie reference Date: 12/04/2018 The patient tolerated the procedure well and was released from the [facility]. She was given instructions regarding seed removal. IMPRESSION: Radioactive seed localization right breast. No apparent complications. The radioactive seed is positioned 6 mm posterior to the architectural distortion targeted for biopsy on 11/17/2018. Location of the seed relative to the targeted architectural distortion and the overlying clip was discussed  with Dr. Donne Hazel on 01/04/2019 at 3 p.m. Electronically Signed   By: Franki Cabot M.D.   On: 01/04/2019 15:18      ASSESSMENT & PLAN:  1. Malignant neoplasm of upper-outer quadrant of right breast in female, estrogen receptor positive ([place])    Images were independantly reviewed by me.  Pathology reports were reviewed by me and discussed with both patient and her son Dr.Fonda.  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 Grade 1 , strongly ER/PR positive and HER2 negative.  Right axially lymph node was not commented on ultrasound. Sentinel lymph node dissection is not done as her surgery was initially designed to resect sclerosing lesion.   # I had a lengthy discussion about the standard care for early stage breast cancer management with patient and her son in details. Standard care recommendation includes right axillary sentinel lymph node biopsy, and if no sentinel lymph node involvement, OncotypeDx score can be considered to determine if she would benefit adjuvant chemotherapy.  Clinically her breast cancer has good features including small size, strong ER/PR positivity. She does have one adverse feature: presence of   lymphovascular invasion. Tumor size and LVI were independently predictive of positive SLNB results. All patients with invasive breast cancer should be offerred SLNB.   She will also need to establish care with Radiation Oncology for evaluation of adjuvant Radiation.  We discussed about the rationale of adjuvant chemotherapy, if she needs, is to reduce her systemic recurrence.Rationale of radiation is to reduce local recurrence rate.   We also discussed about anti estrogen therapy with aromatase inhibitors treatments for 5-10 years.  Rationale was discussed. Side effects including but not limited to bone/joint pain, hot flashes, vasomotor symptoms, bone loss/fractures etc were discussed.  Patient has a history of osteopenia/osteoporosis. I suggest her to have a baseline bone density done.  Discussed at length the risk factors [race, post-menopausal status, use of AI]; prevention/treatment strategies-  including but not limited to exercise calcium and vitamin D daily.  Discussed the potential side effects of bisphosphonate including but not limited to- hypocalcemia and ONJ; discussed not to have invasive dental procedures few weeks around the time of the infusions. Recommend patient to obtain dental clearance.  Patient informs me that she is not interested in any chemotherapy if needed. She is reluctant about proceeding with sentinel lymph node biopsy, and radiation. She may consider adjuvant anti estrogen therapy.  I will present her case on breast tumor board. She will be updated about the board recommendation and then make her decision.    Orders Placed This Encounter  Procedures  . DG Bone Density    Standing Status:   Future    Standing Expiration Date:   03/26/2020    Order Specific Question:   Reason for Exam (SYMPTOM  OR DIAGNOSIS REQUIRED)    Answer:   breast cancer/monitor for future med use    Order Specific Question:   Preferred imaging location?    Answer:   [facility]    All  questions were answered. The patient knows to call the clinic with any problems questions or concerns.  cc Rolm Bookbinder, MD    Return of visit: to be determined.  Thank you for this kind referral and the opportunity to participate in the care of this patient. A copy of today's note is routed to referring provider  Total face to face encounter time for this patient visit was 60 min. >50% of the time was  spent in  counseling and coordination of care.    Earlie Server, MD, PhD Hematology Oncology [facility] Pager- [phone] 01/26/2019

## 2019-01-27 ENCOUNTER — Other Ambulatory Visit: Payer: Self-pay | Admitting: *Deleted

## 2019-02-04 NOTE — Progress Notes (Signed)
Location of Breast Cancer: Right Breast  Histology per Pathology Report:  11/17/18 DIAGNOSIS: A. BREAST, RIGHT UPPER OUTER QUADRANT, 11:00; STEREOTACTIC BIOPSY: - SMALL COMPLEX SCLEROSING LESION/RADIAL SCAR. - FIBROCYSTIC CHANGES AND FEW DUCTAL MICROCALCIFICATIONS. - FOCAL DUCT ECTASIA AND INFLAMMATION. - NEGATIVE FOR ATYPIA AND MALIGNANCY.  01/05/19 FINAL MICROSCOPIC DIAGNOSIS: A. BREAST, RIGHT, LUMPECTOMY: - Invasive ductal carcinoma, Nottingham grade 1 of 3, 0.4 cm arising in a complex sclerosing lesion - Ductal carcinoma in-situ, low-grade - Calcifications associated with carcinoma - Margins uninvolved by carcinoma (0.4 cm; medial margin) - Lymphovascular space invasion present - Previous biopsy site changes present - See oncology table and comment below   Did patient present with symptoms or was this found on screening mammography?: It was found on a screening mammogram.   Past/Anticipated interventions by surgeon, if any: 01/05/19 Procedure: Right breast radioactive seed guided excisional biopsy Surgeon: Dr. Serita Grammes  Past/Anticipated interventions by medical oncology, if any:  Dr. Earlie Server ([place]) ASSESSMENT & PLAN:  1. Malignant neoplasm of upper-outer quadrant of right breast in female, estrogen receptor positive ([place])    Images were independantly reviewed by me.  Pathology reports were reviewed by me and discussed with both patient and her son Dr.Coello.  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 Grade 1 , strongly ER/PR positive and HER2 negative.  Right axially lymph node was not commented on ultrasound. Sentinel lymph node dissection is not done as her surgery was initially designed to resect sclerosing lesion.   # I had a lengthy discussion about the standard care for early stage breast cancer management with patient and her son in details. Standard care recommendation includes right axillary sentinel lymph  node biopsy, and if no sentinel lymph node involvement, OncotypeDx score can be considered to determine if she would benefit adjuvant chemotherapy.  Clinically her breast cancer has good features including small size, strong ER/PR positivity. She does have one adverse feature: presence of  lymphovascular invasion. Tumor size and LVI were independently predictive of positive SLNB results. All patients with invasive breast cancer should be offerred SLNB.   She will also need to establish care with Radiation Oncology for evaluation of adjuvant Radiation.  We discussed about the rationale of adjuvant chemotherapy, if she needs, is to reduce her systemic recurrence.Rationale of radiation is to reduce local recurrence rate.   We also discussed about anti estrogen therapy with aromatase inhibitors treatments for 5-10 years.  Rationale was discussed. Side effects including but not limited to bone/joint pain, hot flashes, vasomotor symptoms, bone loss/fractures etc were discussed.  Patient has a history of osteopenia/osteoporosis. I suggest her to have a baseline bone density done.  Discussed at length the risk factors [race, post-menopausal status, use of AI]; prevention/treatment strategies-  including but not limited to exercise calcium and vitamin D daily.  Discussed the potential side effects of bisphosphonate including but not limited to- hypocalcemia and ONJ; discussed not to have invasive dental procedures few weeks around the time of the infusions. Recommend patient to obtain dental clearance.  Patient informs me that she is not interested in any chemotherapy if needed. She is reluctant about proceeding with sentinel lymph node biopsy, and radiation. She may consider adjuvant anti estrogen therapy.  I will present her case on breast tumor board. She will be updated about the board recommendation and then make her decision.    Lymphedema issues, if any:  She reports swelling to her right breast  surgical site  Pain issues, if  any:  She reports a "stinging sensation" to her right nipple.   SAFETY ISSUES:  Prior radiation? No  Pacemaker/ICD? No  Possible current pregnancy? No  Is the patient on methotrexate? No  Current Complaints / other details:   She is not sure if she will be treated in [place].    Biance Moncrief, Stephani Police, RN 02/04/2019,2:44 PM

## 2019-02-08 ENCOUNTER — Ambulatory Visit
Admission: RE | Admit: 2019-02-08 | Discharge: 2019-02-08 | Disposition: A | Discharge status: Home / Self Care | Payer: Medicare Other | Source: Ambulatory Visit | Attending: Oncology | Admitting: Oncology

## 2019-02-08 ENCOUNTER — Other Ambulatory Visit: Payer: Self-pay | Admitting: Oncology

## 2019-02-08 DIAGNOSIS — Z170 Estrogen receptor positive status [ER+]: Secondary | ICD-10-CM | POA: Diagnosis present

## 2019-02-08 DIAGNOSIS — C50411 Malignant neoplasm of upper-outer quadrant of right female breast: Secondary | ICD-10-CM | POA: Insufficient documentation

## 2019-02-08 DIAGNOSIS — Z853 Personal history of malignant neoplasm of breast: Secondary | ICD-10-CM

## 2019-02-08 NOTE — Progress Notes (Signed)
Radiation Oncology         [phone] ________________________________  Initial outpatient Consultation by telephone as patient was unable to access MyChart video during pandemic precautions   Name: Sheri Shaw MRN: [id number]  Date: 02/09/2019  DOB: 04/03/1940  DH:RCBUL, Tonette Bihari, MD  Rolm Bookbinder, MD   REFERRING PHYSICIAN: Rolm Bookbinder, MD  DIAGNOSIS:    ICD-10-CM   1. 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 female who presented with breast abnormality on the following imaging: screening mammogram on the date of 10/21/2018.  Symptoms, if any, at that time, were: none.   Ultrasound of breast on 11/05/2018 revealed no sonographic correlate for right breast architectural distortion at approximately 11 o'clock.   Biopsy on date of 11/17/2018 showed small complex sclerosing lesion/radial scar.  She proceeded to right lumpectomy on 01/05/2019 with pathology revealing: invasive ductal carcinoma, 0.4 cm arising in a complex sclerosing lesion; DCIS, low grade; calcifications associated with carcinoma; margins uninvolved; lymphovascular space invasion present. ER status: positive; PR status positive, Her2 status negative; Grade 1.  She reports she is quite active.  She is aware of osteoporosis per her bone density scan yesterday. Reports stinging at right nipple and some  breast swelling.  SLN was not performed, nor axillary [place].  She saw Dr Tasia Catchings of medical oncology at [facility] who recommended SLN bx, however Dr. Donne Hazel does not recommend this. Her axilla was clinically negative per Dr Cristal Generous exam. She is concerned about potential rare side effects of Tamoxifen.   PREVIOUS RADIATION THERAPY: No  PAST MEDICAL HISTORY:  has a past medical history of Hypercholesteremia and Radial scar of right breast.    PAST SURGICAL HISTORY: Past Surgical History:  Procedure Laterality Date  . APPENDECTOMY    . BREAST BIOPSY Right 11/17/2018   affirm bx  ribbon marker, path pending  . COLON SURGERY  2005   for diverticulitis  . RADIOACTIVE SEED GUIDED EXCISIONAL BREAST BIOPSY Right 01/05/2019   Procedure: RADIOACTIVE SEED GUIDED EXCISIONAL RIGHT BREAST BIOPSY;  Surgeon: Rolm Bookbinder, MD;  Location: [facility];  Service: General;  Laterality: Right;    FAMILY HISTORY: family history includes Breast cancer in her maternal aunt; Cancer - Other in her maternal aunt; Lung cancer in her maternal uncle.  SOCIAL HISTORY:  reports that she has never smoked. She has never used smokeless tobacco. She reports that she does not drink alcohol or use drugs.  ALLERGIES: Patient has no known allergies.  MEDICATIONS:  Current Outpatient Medications  Medication Sig Dispense Refill  . diazepam (VALIUM) 5 MG tablet Take 5 mg by mouth at bedtime as needed for anxiety. She takes 2.'5mg'$  nightly    . Vitamin D, Cholecalciferol, 50 MCG (2000 UT) CAPS Take by mouth.    Marland Kitchen acetaminophen-codeine (TYLENOL #3) 300-30 MG tablet Take 1 tablet by mouth every 6 (six) hours as needed for  moderate pain. (Patient not taking: Reported on 02/09/2019) 6 tablet 0  . Calcium Citrate-Vitamin D (CALCIUM + D PO) Take by mouth.     No current facility-administered medications for this encounter.    REVIEW OF SYSTEMS: As above   PHYSICAL EXAM:  vitals were not taken for this  visit.       LABORATORY DATA:  No results found for: WBC, HGB, HCT, MCV, PLT CMP  No results found for: NA, K, CL, CO2, GLUCOSE, BUN, CREATININE, CALCIUM, PROT, ALBUMIN, AST, ALT, ALKPHOS, BILITOT, GFRNONAA, GFRAA       RADIOGRAPHY: DG Bone Density  Result Date: 02/08/2019 EXAM: DUAL X-RAY ABSORPTIOMETRY (DXA) FOR BONE MINERAL DENSITY IMPRESSION: Technologist: SCE Your patient Channel Papandrea completed a BMD test on 02/08/2019 using the [place] (analysis version: 14.10) manufactured by [employer]. The following summarizes the results of our evaluation. PATIENT BIOGRAPHICAL: Name: Sheri Shaw, Sheri Shaw Patient ID: [id number] Birth Date: 04/28/1940 Height: 67.3 in. Gender: Female Exam Date: 02/08/2019 Weight: 113.2 lbs. Indications: Advanced Age, Caucasian, Height Loss, Parent Hip Fracture, Postmenopausal Fractures: Treatments: Vitamin D ASSESSMENT: The BMD measured at AP Spine L1-L4 is 0.784 g/cm2 with a T-score of -3.3. This patient is considered osteoporotic according to [facility]) criteria. The scan quality is good. Site Region Measured Measured WHO Young Adult BMD Date       Age      Classification T-score AP Spine L1-L4 02/08/2019 77.8 Osteoporosis -3.3 0.784 g/cm2 DualFemur Neck Left 02/08/2019 77.8 Osteopenia -2.0 0.765 g/cm2 World Health Organization [facility]) criteria for post-menopausal, Caucasian Women: Normal:       T-score at or above -1 SD Osteopenia:   T-score between -1 and -2.5 SD Osteoporosis: T-score at or below -2.5 SD RECOMMENDATIONS: 1. All patients should optimize calcium and vitamin D intake. 2. Consider FDA-approved medical therapies in postmenopausal women and men aged 4 years and older, based on the following: a. A hip or vertebral(clinical or morphometric) fracture b. T-score < -2.5 at the femoral neck or spine after appropriate evaluation to exclude secondary causes c. Low bone mass (T-score between -1.0 and -2.5 at the femoral neck or spine)  and a 10-year probability of a hip fracture > 3% or a 10-year probability of a major osteoporosis-related fracture > 20% based on the [place]-adapted WHO algorithm d. Clinician judgment and/or patient preferences may indicate treatment for people with 10-year fracture probabilities above or below these levels FOLLOW-UP: People with diagnosed cases of osteoporosis or at high risk for fracture should have regular bone mineral density tests. For patients eligible for Medicare, routine testing is allowed once every 2 years. The testing frequency can be increased to one year for patients who have rapidly progressing disease, those who are receiving or discontinuing medical therapy to restore bone mass, or have additional risk factors. Electronically Signed   By: Marlaine Hind M.D.   On: 02/08/2019 10:28      IMPRESSION/PLAN: Right Breast Cancer   She will be discussed tomorrow at our multidisciplinary tumor board.   I went over the results from the [facility] nomogram for her risk of a positive SLN, considering the tumor size, LVSI, and other specific characteristics. The risk is approximately 19%.  She is not keen on having more surgery. I don't think this is necessary for her specific case. Tomorrow at tumor board we can discuss the pros and cons of this (SLN bx) and also consider axillary [place] though this may be less specific post operatively.  I believe the risk of an axillary failure will be quite low if she undergoes adjuvant therapy in the form of antiestrogens and / or radiotherapy.  I spoke with the patient and her son, Sheri Bardwell MD, in detail about her options. With no adjuvant therapy, her risk of locoregional recurrence in the breast or axilla over the next decade is approximately 20%.  With one form of adjuvant therapy, this risk would be cut in half (either RT or antiestrogens could be pursued, and she understands that traditionally antiestrogens are favored if a single modality of therapy is used  adjuvantly, though radiotherapy is likely to be similar in efficacy for her type of cancer which is highly unlikely to metastasize. )  If she pursues radiotherapy, I would treat with high tangents to cover her breast and axilla, over 3 weeks (hypofractionated).    Of note, I discussed the data from the [employer] al trial in the [place] [employer]. She understands that tamoxifen compared to radiation plus tamoxifen demonstrated no survival benefit among the women in this study.  She understands that the main benefit of  adding radiotherapy to anti estrogen therapy would be a very small but measurable local control benefit (risk of locoregional recurrence to be lowered from ~10% --> ~2% over a decade).     She would like to think about the side effects of antiestrogen therapy and radiation therapy before making her decision.   We discussed the risks benefits and side effects of radiotherapy. She understands that the side effects would likely include some skin irritation and fatigue during the weeks of radiation. There is a risk of late effects which include but are not necessarily limited to cosmetic changes and extremely rare lung toxicity.  After a thorough discussion, the patient would like to think about her options.  I asked our patient navigator to schedule a post-op followup with me so we can review her pathology and discuss her options further.  She will call my clinic with her decision after discussing with her family.   This encounter was provided by telemedicine platform by telephone as patient was unable to access MyChart video during pandemic precautions The patient has given verbal consent for this type of encounter and has been advised to only accept a meeting of this type in a secure network environment. The time spent during this encounter was 54 minutes. The attendants for this meeting include Eppie Gibson  and [place].  Sheri Shaw, son, also present for part  of discussion. During the encounter, Eppie Gibson was located at [facility] Radiation Oncology Department.   [place] was located at home.   __________________________________________   Eppie Gibson, MD   This document serves as a record of services personally performed by Eppie Gibson, MD. It was created on her behalf by Wilburn Mylar, a trained medical scribe. The creation of this record is based on the scribe's personal observations and the provider's statements to them. This document has been checked and approved by the attending provider.

## 2019-02-09 ENCOUNTER — Other Ambulatory Visit: Payer: Self-pay

## 2019-02-09 ENCOUNTER — Ambulatory Visit
Admission: RE | Admit: 2019-02-09 | Discharge: 2019-02-09 | Disposition: A | Discharge status: Home / Self Care | Payer: Medicare Other | Source: Ambulatory Visit | Attending: Radiation Oncology | Admitting: Radiation Oncology

## 2019-02-09 ENCOUNTER — Encounter: Payer: Self-pay | Admitting: Radiation Oncology

## 2019-02-09 DIAGNOSIS — C50411 Malignant neoplasm of upper-outer quadrant of right female breast: Secondary | ICD-10-CM

## 2019-02-10 ENCOUNTER — Encounter: Payer: Self-pay | Admitting: Radiation Oncology

## 2019-02-10 ENCOUNTER — Telehealth: Payer: Self-pay | Admitting: Oncology

## 2019-02-10 DIAGNOSIS — Z170 Estrogen receptor positive status [ER+]: Secondary | ICD-10-CM | POA: Insufficient documentation

## 2019-02-10 DIAGNOSIS — C50411 Malignant neoplasm of upper-outer quadrant of right female breast: Secondary | ICD-10-CM | POA: Insufficient documentation

## 2019-02-10 NOTE — Telephone Encounter (Signed)
Patient's case was discussed on tumor board on 02/08/2019. I called patient and updated her about the tumor board consensus. It was confirmed by radiology that axillary lymph node assessment was not done when she had ultrasound of the breast. Tumor board recommends to complete ultrasound axillary lymph node for further evaluation. Although standard of care includes axillary sentinel lymph node biopsy, given her age and small size of tumor, strongly ER/PR positive HER-2 negative, COVID-19 pandemic, patient's preference, tumor board consider that it is reasonable omit sentinel lymph node biopsy if axillary LN is negative on [place].   egarding to adjuvant radiation, radiation oncologist Dr. Baruch Gouty recommends patient to be evaluated and establish care with radiation oncology for further discussion.  Patient informs me that she will have an appointment with radiation oncology Dr. Isidore Moos tomorrow. Tumor board agrees with antiestrogen treatments.  Considering patient's severe osteoporosis, tamoxifen can be considered   Patient has metastatic bladder well to proceed with axillary ultrasound lymph node assessment.  She will call cancer center and update me after she decides.

## 2019-03-05 ENCOUNTER — Telehealth: Payer: Self-pay | Admitting: Oncology

## 2019-03-05 ENCOUNTER — Encounter: Payer: Self-pay | Admitting: *Deleted

## 2019-03-05 NOTE — Telephone Encounter (Signed)
A new patient appt has been scheduled for Sheri Shaw to see Dr. Jana Hakim on 1/12 at 4pm w/labs at 3:30pm. Pt aware to arrive 15 minutes early. I provided the location to our facility to the pt.

## 2019-03-08 ENCOUNTER — Other Ambulatory Visit: Payer: Self-pay

## 2019-03-08 DIAGNOSIS — Z170 Estrogen receptor positive status [ER+]: Secondary | ICD-10-CM

## 2019-03-08 DIAGNOSIS — C50411 Malignant neoplasm of upper-outer quadrant of right female breast: Secondary | ICD-10-CM

## 2019-03-08 NOTE — Progress Notes (Signed)
[place]  [phone] Fax:([phone]) [phone]     ID: ARMIDA Shaw DOB: Sep 21, 1940  MR#: [id number]  EHO#:[id number]  Patient Care Team: Adin Hector, MD as PCP - General (Internal Medicine) Mauro Kaufmann, RN as Oncology Nurse Navigator Rockwell Germany, RN as Oncology Nurse Navigator Eppie Gibson, MD as Consulting Physician (Radiation Oncology) Amauri Keefe, Virgie Dad, MD as Consulting Physician (Oncology) Rolm Bookbinder, MD as Consulting Physician (General Surgery) Chauncey Cruel, MD OTHER MD:  CHIEF COMPLAINT: Invasive ductal carcinoma  CURRENT TREATMENT: Adjuvant radiation pending  HISTORY OF CURRENT ILLNESS: Sheri Shaw Shaw had routine screening mammography on 10/21/2018 showing a possible abnormality in the right breast. She underwent right diagnostic mammography with tomography and right breast ultrasonography at [facility] on 11/05/2018 showing: breast density category C; right breast architectural distortion at approximately 11 o'clock without sonographic correlate; right axilla not evaluated.  Accordingly on 11/17/2018 she proceeded to biopsy of the right breast area in question. The pathology from this procedure ([id number]) showed: a small complex sclerosing lesion/radial scar; fibrocystic changes and few ductal microcalcifications; focal duct ectasia and inflammation; negative for atypia and malignancy.   Accordingly she was referred to surgery and underwent right lumpectomy on 01/05/2019 under Dr. Donne Hazel. 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 Prognostic indicators significant for: estrogen receptor, 95% positive and progesterone receptor, 95% positive, both with strong staining intensity.  Proliferation marker Ki67 at 5%. HER2 negative by immunohistochemistry (1+).  The patient's subsequent history is as detailed below.   INTERVAL HISTORY: Sheri Shaw "Sheri Shaw" was evaluated in the breast cancer clinic on 03/09/2019 she also met with Dr. Isidore Moos through a virtual visit 02/09/2019  Sheri Shaw underwent bone density screening on 02/08/2019. This showed a T-score of -3.3, which is considered osteoporotic.  She did well with the surgery except for a significant hematoma.  This is slowly resolving  REVIEW OF SYSTEMS: There were no specific symptoms leading to the original mammogram, which was routinely scheduled. The patient denies unusual headaches, visual changes, nausea, vomiting, stiff neck, dizziness, or gait imbalance. There has been no cough, phlegm production, or pleurisy, no chest pain or pressure, and no change in bowel or bladder habits. The patient denies fever, rash, bleeding, unexplained fatigue or unexplained weight loss. A detailed review of systems was otherwise entirely negative.   PAST MEDICAL HISTORY: Past Medical History:  Diagnosis Date  . Hypercholesteremia   . Radial scar of right breast     PAST SURGICAL HISTORY: Past Surgical History:  Procedure Laterality Date  . APPENDECTOMY    . BREAST BIOPSY Right 11/17/2018   affirm bx  ribbon marker, path pending  . COLON SURGERY  2005   for diverticulitis  . RADIOACTIVE SEED GUIDED EXCISIONAL BREAST BIOPSY Right 01/05/2019   Procedure: RADIOACTIVE SEED GUIDED EXCISIONAL RIGHT BREAST BIOPSY;  Surgeon: Rolm Bookbinder, MD;  Location: [facility];  Service: General;  Laterality: Right;    FAMILY HISTORY: Family History  Problem Relation Age of Onset  . Breast cancer Maternal Aunt   . Cancer - Other Maternal Aunt   . Lung cancer Maternal Uncle    The patient's father died in his 31s following a stroke.  The patient's mother died from COPD at 5.  The patient had no brothers or sisters.  There is a  maternal  aunt who developed breast cancer in her 59s and one cousin with esophageal cancer.  There is no history of ovarian prostate or pancreatic cancers to the patient's knowledge   GYNECOLOGIC HISTORY:  No LMP recorded. Patient is postmenopausal. Menarche: 79 years old Age at first live birth: 79 years old [place] P 2 LMP around ages 80-55 Contraceptive: yes, previously used without issue HRT 2 or 3 years  Hysterectomy? no BSO?  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  Her husband Sheri Shaw Shaw") worked as an Chief Financial Officer for [employer].  Daughter Sheri Shaw Shaw is a Pharmacist, community here in [place] and son Sheri Shaw Shaw is a Doctor, hospital and then [employer].  He practices OB/GYN in [place].  The patient has 5 grandchildren.  She is a Tourist information centre manager.    ADVANCED DIRECTIVES: In the absence of any documents to the contrary the patient's husband is her healthcare power of attorney  HEALTH MAINTENANCE: Social History   Tobacco Use  . Smoking status: Never Smoker  . Smokeless tobacco: Never Used  Substance Use Topics  . Alcohol use: Never  . Drug use: Never     Colonoscopy: [facility], remote  PAP:   Bone density: 01/2019, -3.3   No Known Allergies  Current Outpatient Medications  Medication Sig Dispense Refill  . Calcium Citrate-Vitamin D (CALCIUM + D PO) Take by mouth.    . diazepam (VALIUM) 5 MG tablet Take 5 mg by mouth at bedtime as needed for anxiety. She takes 2.'5mg'$  nightly    . Vitamin D, Cholecalciferol, 50 MCG (2000 UT) CAPS Take by mouth.     No current facility-administered medications for this visit.    OBJECTIVE: Middle-aged woman in no acute distress  Vitals:   03/09/19 1537  BP: (!) 165/72  Pulse: 78  Resp: 18  Temp: 98.9 F (37.2 C)  SpO2: 100%     Body mass index is 17.55 kg/m.   Wt Readings from Last 3 Encounters:  03/09/19 113 lb 1.6 oz (51.3 kg)    01/25/19 114 lb 6.4 oz (51.9 kg)  01/05/19 111 lb 5.3 oz (50.5 kg)      ECOG FS:1 - Symptomatic but completely ambulatory  Ocular: Sclerae unicteric, pupils round and equal Ear-nose-throat: Wearing a mask Lymphatic: No cervical or supraclavicular adenopathy Lungs no rales or rhonchi Heart regular rate and rhythm Abd soft, nontender, positive bowel sounds MSK no focal spinal tenderness, no joint edema Neuro: non-focal, well-oriented, appropriate affect Breasts: The right breast is status post recent lumpectomy.  There is a significant hematoma which is partly bandaged over.  This distorts the breast contour.  There are no skin changes of concern otherwise.  The left breast is benign.  Both axillae are benign.   LAB RESULTS:  CMP     Component Value Date/Time   NA 135 03/09/2019 1516   K 4.4 03/09/2019 1516   CL 97 (L) 03/09/2019 1516   CO2 26 03/09/2019 1516   GLUCOSE 105 (H) 03/09/2019 1516   BUN 15 03/09/2019 1516   CREATININE 0.95 03/09/2019 1516   CALCIUM 9.8 03/09/2019 1516   PROT 7.7 03/09/2019 1516   ALBUMIN 4.5 03/09/2019 1516   AST 16 03/09/2019 1516   ALT 11 03/09/2019 1516   ALKPHOS 56 03/09/2019 1516   BILITOT <0.2 (L) 03/09/2019 1516   GFRNONAA 58 (L) 03/09/2019 1516   GFRAA >60 03/09/2019 1516  No results found for: TOTALPROTELP, ALBUMINELP, A1GS, A2GS, BETS, BETA2SER, GAMS, MSPIKE, SPEI  Lab Results  Component Value Date   WBC 7.0 03/09/2019   NEUTROABS 3.7 03/09/2019   HGB 13.1 03/09/2019   HCT 40.1 03/09/2019   MCV 94.8 03/09/2019   PLT 233 03/09/2019    No results found for: LABCA2  No components found for: QIHKVQ259  No results for input(s): INR in the last 168 hours.  No results found for: LABCA2  No results found for: DGL875  No results found for: IEP329  No results found for: JJO841  No results found for: CA2729  No components found for: HGQUANT  No results found for: CEA1 / No results found for: CEA1   No results found  for: AFPTUMOR  No results found for: CHROMOGRNA  No results found for: KPAFRELGTCHN, LAMBDASER, KAPLAMBRATIO (kappa/lambda light chains)  No results found for: HGBA, HGBA2QUANT, HGBFQUANT, HGBSQUAN (Hemoglobinopathy evaluation)   No results found for: LDH  No results found for: IRON, TIBC, IRONPCTSAT (Iron and TIBC)  No results found for: FERRITIN  Urinalysis No results found for: COLORURINE, APPEARANCEUR, LABSPEC, PHURINE, GLUCOSEU, HGBUR, BILIRUBINUR, KETONESUR, PROTEINUR, UROBILINOGEN, NITRITE, LEUKOCYTESUR   STUDIES: DG Bone Density  Result Date: 02/08/2019 EXAM: DUAL X-RAY ABSORPTIOMETRY (DXA) FOR BONE MINERAL DENSITY IMPRESSION: Technologist: SCE Your patient Sheri Shaw Shaw completed a BMD test on 02/08/2019 using the [employer] (analysis version: 14.10) manufactured by [employer]. The following summarizes the results of our evaluation. PATIENT BIOGRAPHICAL: Name: Sheri Shaw Shaw, Sheri Shaw Shaw Patient ID: [id number] Birth Date: 11-Aug-1940 Height: 67.3 in. Gender: Female Exam Date: 02/08/2019 Weight: 113.2 lbs. Indications: Advanced Age, Caucasian, Height Loss, Parent Hip Fracture, Postmenopausal Fractures: Treatments: Vitamin D ASSESSMENT: The BMD measured at AP Spine L1-L4 is 0.784 g/cm2 with a T-score of -3.3. This patient is considered osteoporotic according to [facility]) criteria. The scan quality is good. Site Region Measured Measured WHO Young Adult BMD Date       Age      Classification T-score AP Spine L1-L4 02/08/2019 77.8 Osteoporosis -3.3 0.784 g/cm2 DualFemur Neck Left 02/08/2019 77.8 Osteopenia -2.0 0.765 g/cm2 World Health Organization [facility]) criteria for post-menopausal, Caucasian Women: Normal:       T-score at or above -1 SD Osteopenia:   T-score between -1 and -2.5 SD Osteoporosis: T-score at or below -2.5 SD RECOMMENDATIONS: 1. All patients should optimize calcium and vitamin D intake. 2. Consider FDA-approved medical therapies in  postmenopausal women and men aged 44 years and older, based on the following: a. A hip or vertebral(clinical or morphometric) fracture b. T-score < -2.5 at the femoral neck or spine after appropriate evaluation to exclude secondary causes c. Low bone mass (T-score between -1.0 and -2.5 at the femoral neck or spine) and a 10-year probability of a hip fracture > 3% or a 10-year probability of a major osteoporosis-related fracture > 20% based on the [place]-adapted WHO algorithm d. Clinician judgment and/or patient preferences may indicate treatment for people with 10-year fracture probabilities above or below these levels FOLLOW-UP: People with diagnosed cases of osteoporosis or at high risk for fracture should have regular bone mineral density tests. For patients eligible for Medicare, routine testing is allowed once every 2 years. The testing frequency can be increased to one year for patients who have rapidly progressing disease, those who are receiving or discontinuing medical therapy to restore bone mass, or have additional risk factors. Electronically Signed   By: Marlaine Hind M.D.   On: 02/08/2019  10:28     ELIGIBLE FOR AVAILABLE RESEARCH PROTOCOL: no  ASSESSMENT: 79 y.o. [place] woman status post right breast outer quadrant biopsy 11/17/2018 showing a complex sclerosing lesion  (1) right upper outer quadrant lumpectomy (no sentinel lymph node sampling) 01/05/2019 showed a pT1a invasive ductal carcinoma, grade 1, estrogen and progesterone receptor strongly positive, HER-2 not amplified, with an MIB-1 of 5%  (2) adjuvant radiation to follow  (3) discussed antiestrogens 03/09/2019: Patient opted against  PLAN: We first reviewed the fact that cancer is not one disease but more than 100 different diseases and that it is important to keep them separate-- otherwise when friends and relatives discuss their own cancer experiences with [facility] confusion can result. Similarly we explained that if breast cancer  spreads to the bone or liver, the patient would not have bone cancer or liver cancer, but breast cancer in the bone and breast cancer in the liver: one cancer in three places-- not 3 different cancers which otherwise would have to be treated in 3 different ways.  We discussed the original biopsy which showed a complex sclerosing lesion.  This is not a cancer but can be associated with cancers and that is the reason she had a lumpectomy.  We reviewed her pathology and she understands she had an invasive ductal carcinoma which was only 4 mm.  It was also not aggressive looking and very slow-growing.  The chance of this having spread outside the breast and armpit is so low that NCCN guidelines only suggest a discussion of systemic treatment, do not positively recommend antiestrogens.  Basically the main concern here is local recurrence note systemic spread.  Of course if we had known originally that she had invasive disease in the breast her surgeon would have done a sentinel lymph node sampling.  We have discussed this in conference and I also am comfortable with her not adding additional surgery to check armpit lymph nodes so long as she does do something else, namely either radiation or antiestrogens.  If she did not do either of those then I would be less sanguine on that regard.  She already met with Dr. Isidore Moos and I also reviewed radiation issues with Sheri Shaw.  I think radiation is the right choice for her.  It is brief, is unlikely to have significant long-term side effects (she understands the right breast will look and feel a little different) and Dr. Isidore Moos will make sure that the armpit as well as the breast is covered.  We also discussed antiestrogens and specifically tamoxifen in detail and I gave her along right up on the possible toxicities side effects and complications.  The benefit of tamoxifen is that it would decrease the risk of breast cancer of course but also strengthen her bones.  She is at  this point not wishing to pursue that option.  The plan then is to proceed to radiation.  Sheri Shaw will return to see me in May.  At that point we will discuss her osteoporosis in detail since that actually concerns me more than the breast cancer problem.  Trust has a good understanding of the overall plan. She agrees with it. She knows the goal of treatment in her case is cure. She will call with any problems that may develop before her next visit here.  Total encounter time 60 minutes   Chauncey Cruel, MD   03/09/2019 5:07 PM Medical Oncology and Hematology [facility] [place], [place] [address] Tel. [phone]  Fax. [phone]   This document serves as a record of services personally performed by Lurline Del, MD. It was created on his behalf by Wilburn Mylar, a trained medical scribe. The creation of this record is based on the scribe's personal observations and the provider's statements to them.   I, Lurline Del MD, have reviewed the above documentation for accuracy and completeness, and I agree with the above.    *Total Encounter Time as defined by the Centers for Medicare and Medicaid Services includes, in addition to the face-to-face time of a patient visit (documented in the note above) non-face-to-face time: obtaining and reviewing outside history, ordering and reviewing medications, tests or procedures, care coordination (communications with other health care professionals or caregivers) and documentation in the medical record.

## 2019-03-09 ENCOUNTER — Inpatient Hospital Stay: Payer: Medicare Other | Attending: Oncology | Admitting: Oncology

## 2019-03-09 ENCOUNTER — Other Ambulatory Visit: Payer: Self-pay

## 2019-03-09 ENCOUNTER — Inpatient Hospital Stay: Payer: Medicare Other

## 2019-03-09 VITALS — BP 165/72 | HR 78 | Temp 98.9°F | Resp 18 | Ht 67.32 in | Wt 113.1 lb

## 2019-03-09 DIAGNOSIS — Z170 Estrogen receptor positive status [ER+]: Secondary | ICD-10-CM | POA: Diagnosis not present

## 2019-03-09 DIAGNOSIS — Z803 Family history of malignant neoplasm of breast: Secondary | ICD-10-CM

## 2019-03-09 DIAGNOSIS — Z801 Family history of malignant neoplasm of trachea, bronchus and lung: Secondary | ICD-10-CM | POA: Diagnosis not present

## 2019-03-09 DIAGNOSIS — M818 Other osteoporosis without current pathological fracture: Secondary | ICD-10-CM

## 2019-03-09 DIAGNOSIS — C50411 Malignant neoplasm of upper-outer quadrant of right female breast: Secondary | ICD-10-CM | POA: Diagnosis present

## 2019-03-09 DIAGNOSIS — Z809 Family history of malignant neoplasm, unspecified: Secondary | ICD-10-CM | POA: Diagnosis not present

## 2019-03-09 DIAGNOSIS — M810 Age-related osteoporosis without current pathological fracture: Secondary | ICD-10-CM | POA: Diagnosis not present

## 2019-03-09 LAB — CBC WITH DIFFERENTIAL (CANCER CENTER ONLY)
Abs Immature Granulocytes: 0.02 10*3/uL (ref 0.00–0.07)
Basophils Absolute: 0.1 10*3/uL (ref 0.0–0.1)
Basophils Relative: 1 %
Eosinophils Absolute: 0.1 10*3/uL (ref 0.0–0.5)
Eosinophils Relative: 2 %
HCT: 40.1 % (ref 36.0–46.0)
Hemoglobin: 13.1 g/dL (ref 12.0–15.0)
Immature Granulocytes: 0 %
Lymphocytes Relative: 37 %
Lymphs Abs: 2.6 10*3/uL (ref 0.7–4.0)
MCH: 31 pg (ref 26.0–34.0)
MCHC: 32.7 g/dL (ref 30.0–36.0)
MCV: 94.8 fL (ref 80.0–100.0)
Monocytes Absolute: 0.5 10*3/uL (ref 0.1–1.0)
Monocytes Relative: 7 %
Neutro Abs: 3.7 10*3/uL (ref 1.7–7.7)
Neutrophils Relative %: 53 %
Platelet Count: 233 10*3/uL (ref 150–400)
RBC: 4.23 MIL/uL (ref 3.87–5.11)
RDW: 12.4 % (ref 11.5–15.5)
WBC Count: 7 10*3/uL (ref 4.0–10.5)
nRBC: 0 % (ref 0.0–0.2)

## 2019-03-09 LAB — CMP (CANCER CENTER ONLY)
ALT: 11 U/L (ref 0–44)
AST: 16 U/L (ref 15–41)
Albumin: 4.5 g/dL (ref 3.5–5.0)
Alkaline Phosphatase: 56 U/L (ref 38–126)
Anion gap: 12 (ref 5–15)
BUN: 15 mg/dL (ref 8–23)
CO2: 26 mmol/L (ref 22–32)
Calcium: 9.8 mg/dL (ref 8.9–10.3)
Chloride: 97 mmol/L — ABNORMAL LOW (ref 98–111)
Creatinine: 0.95 mg/dL (ref 0.44–1.00)
GFR, Est AFR Am: >60 mL/min
GFR, Estimated: 58 mL/min — ABNORMAL LOW
Glucose, Bld: 105 mg/dL — ABNORMAL HIGH (ref 70–99)
Potassium: 4.4 mmol/L (ref 3.5–5.1)
Sodium: 135 mmol/L (ref 135–145)
Total Bilirubin: <0.2 mg/dL — ABNORMAL LOW (ref 0.3–1.2)
Total Protein: 7.7 g/dL (ref 6.5–8.1)

## 2019-03-10 ENCOUNTER — Encounter: Payer: Self-pay | Admitting: *Deleted

## 2019-03-10 ENCOUNTER — Telehealth: Payer: Self-pay | Admitting: Oncology

## 2019-03-10 NOTE — Telephone Encounter (Signed)
I talk with patient regarding schedule  

## 2019-03-12 ENCOUNTER — Encounter: Payer: Self-pay | Admitting: Radiation Oncology

## 2019-03-12 ENCOUNTER — Other Ambulatory Visit: Payer: Self-pay

## 2019-03-12 ENCOUNTER — Ambulatory Visit
Admission: RE | Admit: 2019-03-12 | Discharge: 2019-03-12 | Disposition: A | Discharge status: Home / Self Care | Payer: Medicare Other | Source: Ambulatory Visit | Attending: Radiation Oncology | Admitting: Radiation Oncology

## 2019-03-12 DIAGNOSIS — C50411 Malignant neoplasm of upper-outer quadrant of right female breast: Secondary | ICD-10-CM | POA: Diagnosis not present

## 2019-03-12 NOTE — Progress Notes (Signed)
  Radiation Oncology         [phone]) [phone] ________________________________  Name: Sheri Shaw MRN: [id number]  Date: 03/12/2019  DOB: [birth year]  SIMULATION AND TREATMENT PLANNING NOTE    Outpatient  DIAGNOSIS:     ICD-10-CM   1. Carcinoma of upper-outer quadrant of right breast in female, estrogen receptor positive ([place])  C50.411    Z17.0     NARRATIVE:  The patient was brought to the [facility].  Identity was confirmed.  All relevant records and images related to the planned course of therapy were reviewed.  The patient freely provided informed written consent to proceed with treatment after reviewing the details related to the planned course of therapy. The consent form was witnessed and verified by the simulation staff.    Physical exam is notable for a seroma and bruise in the lumpectomy site.  She states that she is due for her second Covid vaccine on January 26 and working on scheduling that.  She and I discussed the pros and cons of starting treatment before her second vaccine.  We will push her treatment back until February 1 in hopes that she will receive her second shot before then.  Then, the patient was set-up in a stable reproducible supine position for radiation therapy with her ipsilateral arm over her head, and her upper body secured in a custom-made Vac-lok device.  CT images were obtained.  Surface markings were placed.  The CT images were loaded into the planning software.    TREATMENT PLANNING NOTE: Treatment planning then occurred.  The radiation prescription was entered and confirmed.     A total of 3 medically necessary complex treatment devices were fabricated and supervised by me: 2 fields with MLCs for custom blocks to protect heart, and lungs;  and, a Vac-lok. MORE COMPLEX DEVICES MAY BE MADE IN DOSIMETRY FOR FIELD IN FIELD BEAMS FOR DOSE HOMOGENEITY.  I have requested : 3D Simulation which is medically necessary to give adequate dose to at  risk tissues while sparing lungs and heart.  I have requested a DVH of the following structures: lungs, heart, right lumpectomy cavity and right axilla.    The patient will receive 40.05 Gy in 15 fractions to the right lumpectomy cavity, right breast, and right axilla with 2 high tangential fields.   This will not be followed by a boost.  Optical Surface Tracking Plan:  Since intensity modulated radiotherapy (IMRT) and 3D conformal radiation treatment methods are predicated on accurate and precise positioning for treatment, intrafraction motion monitoring is medically necessary to ensure accurate and safe treatment delivery. The ability to quantify intrafraction motion without excessive ionizing radiation dose can only be performed with optical surface tracking. Accordingly, surface imaging offers the opportunity to obtain 3D measurements of patient position throughout IMRT and 3D treatments without excessive radiation exposure. I am ordering optical surface tracking for this patient's upcoming course of radiotherapy.  ________________________________   Reference:  Ursula Alert, J, et al. Surface imaging-based analysis of intrafraction motion for breast radiotherapy patients.Journal of [place], n. 6, nov. 2014. ISSN [id number].  Available at: <http://www.jacmp.org/index.php/jacmp/article/view/4957>.    -----------------------------------  Eppie Gibson, MD

## 2019-03-16 DIAGNOSIS — C50411 Malignant neoplasm of upper-outer quadrant of right female breast: Secondary | ICD-10-CM | POA: Diagnosis not present

## 2019-03-22 ENCOUNTER — Ambulatory Visit: Payer: Medicare Other | Admitting: Radiation Oncology

## 2019-03-23 ENCOUNTER — Ambulatory Visit: Payer: Medicare Other

## 2019-03-24 ENCOUNTER — Ambulatory Visit: Payer: Medicare Other

## 2019-03-25 ENCOUNTER — Ambulatory Visit: Payer: Medicare Other

## 2019-03-26 ENCOUNTER — Ambulatory Visit: Payer: Medicare Other

## 2019-03-29 ENCOUNTER — Ambulatory Visit: Payer: Medicare Other

## 2019-03-29 ENCOUNTER — Other Ambulatory Visit: Payer: Self-pay

## 2019-03-29 ENCOUNTER — Ambulatory Visit
Admission: RE | Admit: 2019-03-29 | Discharge: 2019-03-29 | Disposition: A | Discharge status: Home / Self Care | Payer: Medicare Other | Source: Ambulatory Visit | Attending: Radiation Oncology | Admitting: Radiation Oncology

## 2019-03-29 DIAGNOSIS — Z170 Estrogen receptor positive status [ER+]: Secondary | ICD-10-CM | POA: Insufficient documentation

## 2019-03-29 DIAGNOSIS — C50411 Malignant neoplasm of upper-outer quadrant of right female breast: Secondary | ICD-10-CM | POA: Diagnosis not present

## 2019-03-29 MED ORDER — ALRA NON-METALLIC DEODORANT (RAD-ONC)
1.0000 "application " | Freq: Once | TOPICAL | Status: AC
  Administered 2019-03-29: 1 via TOPICAL

## 2019-03-29 MED ORDER — RADIAPLEXRX EX GEL: Freq: Once | CUTANEOUS | Status: AC

## 2019-03-29 NOTE — Progress Notes (Signed)

## 2019-03-30 ENCOUNTER — Ambulatory Visit
Admission: RE | Admit: 2019-03-30 | Discharge: 2019-03-30 | Disposition: A | Discharge status: Home / Self Care | Payer: Medicare Other | Source: Ambulatory Visit | Attending: Radiation Oncology | Admitting: Radiation Oncology

## 2019-03-30 ENCOUNTER — Other Ambulatory Visit: Payer: Self-pay

## 2019-03-30 DIAGNOSIS — C50411 Malignant neoplasm of upper-outer quadrant of right female breast: Secondary | ICD-10-CM | POA: Diagnosis not present

## 2019-03-31 ENCOUNTER — Other Ambulatory Visit: Payer: Self-pay

## 2019-03-31 ENCOUNTER — Ambulatory Visit
Admission: RE | Admit: 2019-03-31 | Discharge: 2019-03-31 | Disposition: A | Discharge status: Home / Self Care | Payer: Medicare Other | Source: Ambulatory Visit | Attending: Radiation Oncology | Admitting: Radiation Oncology

## 2019-03-31 DIAGNOSIS — C50411 Malignant neoplasm of upper-outer quadrant of right female breast: Secondary | ICD-10-CM | POA: Diagnosis not present

## 2019-04-01 ENCOUNTER — Ambulatory Visit
Admission: RE | Admit: 2019-04-01 | Discharge: 2019-04-01 | Disposition: A | Discharge status: Home / Self Care | Payer: Medicare Other | Source: Ambulatory Visit | Attending: Radiation Oncology | Admitting: Radiation Oncology

## 2019-04-01 ENCOUNTER — Other Ambulatory Visit: Payer: Self-pay

## 2019-04-01 DIAGNOSIS — C50411 Malignant neoplasm of upper-outer quadrant of right female breast: Secondary | ICD-10-CM | POA: Diagnosis not present

## 2019-04-02 ENCOUNTER — Ambulatory Visit
Admission: RE | Admit: 2019-04-02 | Discharge: 2019-04-02 | Disposition: A | Discharge status: Home / Self Care | Payer: Medicare Other | Source: Ambulatory Visit | Attending: Radiation Oncology | Admitting: Radiation Oncology

## 2019-04-02 DIAGNOSIS — C50411 Malignant neoplasm of upper-outer quadrant of right female breast: Secondary | ICD-10-CM | POA: Diagnosis not present

## 2019-04-05 ENCOUNTER — Ambulatory Visit
Admission: RE | Admit: 2019-04-05 | Discharge: 2019-04-05 | Disposition: A | Discharge status: Home / Self Care | Payer: Medicare Other | Source: Ambulatory Visit | Attending: Radiation Oncology | Admitting: Radiation Oncology

## 2019-04-05 ENCOUNTER — Other Ambulatory Visit: Payer: Self-pay

## 2019-04-05 DIAGNOSIS — C50411 Malignant neoplasm of upper-outer quadrant of right female breast: Secondary | ICD-10-CM | POA: Diagnosis not present

## 2019-04-06 ENCOUNTER — Other Ambulatory Visit: Payer: Self-pay

## 2019-04-06 ENCOUNTER — Ambulatory Visit
Admission: RE | Admit: 2019-04-06 | Discharge: 2019-04-06 | Disposition: A | Discharge status: Home / Self Care | Payer: Medicare Other | Source: Ambulatory Visit | Attending: Radiation Oncology | Admitting: Radiation Oncology

## 2019-04-06 DIAGNOSIS — C50411 Malignant neoplasm of upper-outer quadrant of right female breast: Secondary | ICD-10-CM | POA: Diagnosis not present

## 2019-04-07 ENCOUNTER — Ambulatory Visit
Admission: RE | Admit: 2019-04-07 | Discharge: 2019-04-07 | Disposition: A | Discharge status: Home / Self Care | Payer: Medicare Other | Source: Ambulatory Visit | Attending: Radiation Oncology | Admitting: Radiation Oncology

## 2019-04-07 DIAGNOSIS — C50411 Malignant neoplasm of upper-outer quadrant of right female breast: Secondary | ICD-10-CM | POA: Diagnosis not present

## 2019-04-08 ENCOUNTER — Encounter: Payer: Self-pay | Admitting: *Deleted

## 2019-04-08 ENCOUNTER — Ambulatory Visit: Payer: Medicare Other

## 2019-04-08 ENCOUNTER — Other Ambulatory Visit: Payer: Self-pay

## 2019-04-08 DIAGNOSIS — C50411 Malignant neoplasm of upper-outer quadrant of right female breast: Secondary | ICD-10-CM | POA: Diagnosis not present

## 2019-04-09 ENCOUNTER — Other Ambulatory Visit: Payer: Self-pay

## 2019-04-09 ENCOUNTER — Ambulatory Visit: Payer: Medicare Other

## 2019-04-09 DIAGNOSIS — C50411 Malignant neoplasm of upper-outer quadrant of right female breast: Secondary | ICD-10-CM | POA: Diagnosis not present

## 2019-04-12 ENCOUNTER — Ambulatory Visit
Admission: RE | Admit: 2019-04-12 | Discharge: 2019-04-12 | Disposition: A | Discharge status: Home / Self Care | Payer: Medicare Other | Source: Ambulatory Visit | Attending: Radiation Oncology | Admitting: Radiation Oncology

## 2019-04-12 ENCOUNTER — Other Ambulatory Visit: Payer: Self-pay

## 2019-04-12 DIAGNOSIS — C50411 Malignant neoplasm of upper-outer quadrant of right female breast: Secondary | ICD-10-CM | POA: Diagnosis not present

## 2019-04-12 MED ORDER — SONAFINE EX EMUL
1.0000 "application " | Freq: Once | CUTANEOUS | Status: AC
  Administered 2019-04-12: 1 via TOPICAL

## 2019-04-13 ENCOUNTER — Ambulatory Visit
Admission: RE | Admit: 2019-04-13 | Discharge: 2019-04-13 | Disposition: A | Discharge status: Home / Self Care | Payer: Medicare Other | Source: Ambulatory Visit | Attending: Radiation Oncology | Admitting: Radiation Oncology

## 2019-04-13 ENCOUNTER — Other Ambulatory Visit: Payer: Self-pay

## 2019-04-13 DIAGNOSIS — C50411 Malignant neoplasm of upper-outer quadrant of right female breast: Secondary | ICD-10-CM | POA: Diagnosis not present

## 2019-04-14 ENCOUNTER — Ambulatory Visit
Admission: RE | Admit: 2019-04-14 | Discharge: 2019-04-14 | Disposition: A | Discharge status: Home / Self Care | Payer: Medicare Other | Source: Ambulatory Visit | Attending: Radiation Oncology | Admitting: Radiation Oncology

## 2019-04-14 ENCOUNTER — Other Ambulatory Visit: Payer: Self-pay

## 2019-04-14 DIAGNOSIS — C50411 Malignant neoplasm of upper-outer quadrant of right female breast: Secondary | ICD-10-CM | POA: Diagnosis not present

## 2019-04-15 ENCOUNTER — Other Ambulatory Visit: Payer: Self-pay

## 2019-04-15 ENCOUNTER — Ambulatory Visit
Admission: RE | Admit: 2019-04-15 | Discharge: 2019-04-15 | Disposition: A | Discharge status: Home / Self Care | Payer: Medicare Other | Source: Ambulatory Visit | Attending: Radiation Oncology | Admitting: Radiation Oncology

## 2019-04-15 ENCOUNTER — Encounter: Payer: Self-pay | Admitting: *Deleted

## 2019-04-15 DIAGNOSIS — C50411 Malignant neoplasm of upper-outer quadrant of right female breast: Secondary | ICD-10-CM | POA: Diagnosis not present

## 2019-04-16 ENCOUNTER — Other Ambulatory Visit: Payer: Self-pay

## 2019-04-16 ENCOUNTER — Ambulatory Visit
Admission: RE | Admit: 2019-04-16 | Discharge: 2019-04-16 | Disposition: A | Discharge status: Home / Self Care | Payer: Medicare Other | Source: Ambulatory Visit | Attending: Radiation Oncology | Admitting: Radiation Oncology

## 2019-04-16 ENCOUNTER — Encounter: Payer: Self-pay | Admitting: Radiation Oncology

## 2019-04-16 DIAGNOSIS — C50411 Malignant neoplasm of upper-outer quadrant of right female breast: Secondary | ICD-10-CM | POA: Diagnosis not present

## 2019-05-05 NOTE — Progress Notes (Signed)
  Patient Name: Sheri Shaw MRN: [id number] DOB: 01-01-1941 Referring Physician: Ramonita Lab (Profile Not Attached) Date of Service: 04/16/2019 [facility], [place]                                                        End Of Treatment Note  Diagnoses: C50.411-Malignant neoplasm of upper-outer quadrant of right female breast  Cancer Staging: Cancer Staging Carcinoma of upper-outer quadrant of right breast in female, estrogen receptor positive ([place]) Staging form: Breast, AJCC 8th Edition - Pathologic stage from 02/09/2019: Stage Unknown (pT1a, pNX, cM0, G1, ER+, PR+, HER2-) - Signed by Eppie Gibson, MD on 02/10/2019 - Clinical: No stage assigned - Unsigned  Intent: Curative  Radiation Treatment Dates: 03/29/2019 through 04/16/2019  Site Technique Total Dose (Gy) Dose per Fx (Gy) Completed Fx Beam Energies  Breast, Right: Breast_Rt_Ax 3D 40.05/40.05 2.67 15/15 6X, 10X   Narrative: The patient tolerated radiation therapy relatively well.   Plan: The patient will follow-up with radiation oncology in 1 month, or as needed.  -----------------------------------  Eppie Gibson, MD

## 2019-05-13 NOTE — Progress Notes (Signed)
I called the patient today about her upcoming follow-up appointment in radiation oncology.   Given the state of the COVID-19 pandemic, concerning case numbers in our community, and guidance from [facility], I offered a phone assessment with the patient to determine if coming to the clinic was necessary. She accepted.  I let the patient know that I had spoken with Dr. Isidore Moos, and she wanted them to know the importance of washing their hands for at least 20 seconds at a time, especially after going out in public, and before they eat.  Limit going out in public whenever possible. Do not touch your face, unless your hands are clean, such as when bathing. Get plenty of rest, eat well, and stay hydrated.   The patient denies any symptomatic concerns.  Specifically, they report good healing of their skin in the radiation fields.  Skin is intact.    I recommended that she continue skin care by applying oil or lotion with vitamin E to the skin in the radiation fields, BID, for 2 more months.  Continue follow-up with medical oncology - follow-up is scheduled on 07/08/19 with Dr. Jana Hakim.  I explained that yearly mammograms are important for patients with intact breast tissue, and physical exams are important after mastectomy for patients that cannot undergo mammography.  I encouraged her to call if she had further questions or concerns about her healing. Otherwise, she will follow-up PRN in radiation oncology. Patient is pleased with this plan, and we will cancel her upcoming follow-up to reduce the risk of COVID-19 transmission.

## 2019-05-14 ENCOUNTER — Ambulatory Visit
Admission: RE | Admit: 2019-05-14 | Discharge: 2019-05-14 | Disposition: A | Discharge status: Home / Self Care | Payer: Medicare Other | Source: Ambulatory Visit | Attending: Radiation Oncology | Admitting: Radiation Oncology

## 2019-07-07 NOTE — Progress Notes (Signed)
Sheri Shaw  [phone] [phone]     ID: Sheri Shaw DOB: 06-Jan-1941  MR#: [id number]  RXV#:[id number]  Patient Care Team: Adin Hector, MD as PCP - General (Internal Medicine) Mauro Kaufmann, RN as Oncology Nurse Navigator Rockwell Germany, RN as Oncology Nurse Navigator Eppie Gibson, MD as Consulting Physician (Radiation Oncology) Demarlo Riojas, Virgie Dad, MD as Consulting Physician (Oncology) Rolm Bookbinder, MD as Consulting Physician (General Surgery) Chauncey Cruel, MD OTHER MD:  CHIEF COMPLAINT: Invasive ductal carcinoma  CURRENT TREATMENT: Observation   INTERVAL HISTORY: Sheri "Sheri Shaw" returns today for follow up of her estrogen receptor positive breast cancer. She was evaluated in the breast cancer clinic on 03/09/2019.  She opted to proceed with radiation therapy to the right axilla. She received treatment from 03/29/2019 through 04/16/2019 under Dr. Isidore Moos.  Her most recent bone density screening on 02/08/2019 showed a T-score of -3.3, which is considered osteoporotic.   REVIEW OF SYSTEMS: Sheri Shaw did well with radiation, with minimal fatigue, mild itching on her skin but no significant peeling or other complications.  She feels back to baseline, walks maybe 20 to 30 minutes about 3 or 4 days a week, and is working on her garden.  She is concerned about genetics and wanted information regarding that.  Otherwise a detailed review of systems today was stable   HISTORY OF CURRENT ILLNESS: From the original intake note:  Sheri Shaw had routine screening mammography on 10/21/2018 showing a possible abnormality in the right breast. She underwent right diagnostic mammography with tomography and right breast ultrasonography at [facility] on 11/05/2018 showing: breast density category C; right breast architectural distortion at approximately 11 o'clock without sonographic correlate; right axilla not  evaluated.  Accordingly on 11/17/2018 she proceeded to biopsy of the right breast area in question. The pathology from this procedure ([id number]) showed: a small complex sclerosing lesion/radial scar; fibrocystic changes and few ductal microcalcifications; focal duct ectasia and inflammation; negative for atypia and malignancy.   Accordingly she was referred to surgery and underwent right lumpectomy on 01/05/2019 under Dr. Donne Hazel. 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 Prognostic indicators significant for: estrogen receptor, 95% positive and progesterone receptor, 95% positive, both with strong staining intensity. Proliferation marker Ki67 at 5%. HER2 negative by immunohistochemistry (1+).  The patient's subsequent history is as detailed below.   PAST MEDICAL HISTORY: Past Medical History:  Diagnosis Date  . Hypercholesteremia   . Radial scar of right breast     PAST SURGICAL HISTORY: Past Surgical History:  Procedure Laterality Date  . APPENDECTOMY    . BREAST BIOPSY Right 11/17/2018   affirm bx  ribbon marker, path pending  . COLON SURGERY  2005   for diverticulitis  . RADIOACTIVE SEED GUIDED EXCISIONAL BREAST BIOPSY Right 01/05/2019   Procedure: RADIOACTIVE SEED GUIDED EXCISIONAL RIGHT BREAST BIOPSY;  Surgeon: Rolm Bookbinder, MD;  Location: [facility];  Service: General;  Laterality: Right;    FAMILY HISTORY: Family History  Problem Relation Age of Onset  . Breast cancer Maternal Aunt   . Cancer - Other Maternal Aunt   . Lung cancer Maternal Uncle    The patient's father died in his 74s following a stroke.  The patient's mother died from COPD at 55.  The patient had no brothers or  sisters.  There is a maternal aunt who developed  breast cancer in her 23s and one cousin with esophageal cancer.  There is no history of ovarian prostate or pancreatic cancers to the patient's knowledge   GYNECOLOGIC HISTORY:  No LMP recorded. Patient is postmenopausal. Menarche: 79 years old Age at first live birth: 79 years old [place] P 2 LMP around ages [age] Contraceptive: yes, previously used without issue HRT 2 or 3 years  Hysterectomy? no BSO?  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  Her husband [name]") worked as an Chief Financial Officer for [employer].  Daughter [name] is a Pharmacist, community here in [place] and son [name] is a Doctor, hospital and then [employer].  He practices OB/GYN in [place].  The patient has 5 grandchildren.  She is a Tourist information centre manager.    ADVANCED DIRECTIVES: In the absence of any documents to the contrary the patient's husband is her healthcare power of attorney  HEALTH MAINTENANCE: Social History   Tobacco Use  . Smoking status: Never Smoker  . Smokeless tobacco: Never Used  Substance Use Topics  . Alcohol use: Never  . Drug use: Never     Colonoscopy: [facility], remote  PAP:   Bone density: 01/2019, -3.3   No Known Allergies  Current Outpatient Medications  Medication Sig Dispense Refill  . Calcium Citrate-Vitamin D (CALCIUM + D PO) Take by mouth.    . diazepam (VALIUM) 5 MG tablet Take 5 mg by mouth at bedtime as needed for anxiety. She takes 2.'5mg'$  nightly    . Vitamin D, Cholecalciferol, 50 MCG (2000 UT) CAPS Take by mouth.     No current facility-administered medications for this visit.    OBJECTIVE: White woman in no acute distress  Vitals:   07/08/19 1134  BP: 137/64  Pulse: 72  Resp: 18  Temp: 98.3 F (36.8 C)  SpO2: 100%     Body mass index is 17.7 kg/m.   Wt Readings from Last 3 Encounters:  07/08/19 114 lb 1.6 oz (51.8 kg)  03/09/19 113 lb 1.6 oz (51.3 kg)  01/25/19  114 lb 6.4 oz (51.9 kg)      ECOG FS:1 - Symptomatic but completely ambulatory  Sclerae unicteric, EOMs intact Wearing a mask No cervical or supraclavicular adenopathy Lungs no rales or rhonchi Heart regular rate and rhythm Abd soft, nontender, positive bowel sounds MSK no focal spinal tenderness, no upper extremity lymphedema Neuro: nonfocal, well oriented, appropriate affect Breasts: The right breast is status post lumpectomy and radiation.  There is a little area of bruising or scarring above the areola.  Otherwise the cosmetic result is excellent and there is certainly no evidence of residual or recurrent disease.  The left breast is benign.  Both axillae are benign.   LAB RESULTS:  CMP     Component Value Date/Time   NA 135 03/09/2019 1516   K 4.4 03/09/2019 1516   CL 97 (L) 03/09/2019 1516   CO2 26 03/09/2019 1516   GLUCOSE 105 (H) 03/09/2019 1516   BUN 15 03/09/2019 1516   CREATININE 0.95 03/09/2019 1516   CALCIUM 9.8 03/09/2019 1516   PROT 7.7 03/09/2019 1516   ALBUMIN 4.5 03/09/2019 1516   AST 16 03/09/2019 1516   ALT 11 03/09/2019 1516   ALKPHOS 56 03/09/2019 1516   BILITOT <0.2 (L) 03/09/2019 1516   GFRNONAA 58 (L) 03/09/2019  [place] >60 03/09/2019 1516    No results found for: TOTALPROTELP, ALBUMINELP, A1GS, A2GS, BETS, BETA2SER, GAMS, MSPIKE, SPEI  Lab Results  Component Value Date   WBC 7.0 03/09/2019   NEUTROABS 3.7 03/09/2019   HGB 13.1 03/09/2019   HCT 40.1 03/09/2019   MCV 94.8 03/09/2019   PLT 233 03/09/2019    No results found for: LABCA2  No components found for: QBVQXI503  No results for input(s): INR in the last 168 hours.  No results found for: LABCA2  No results found for: UUE280  No results found for: KLK917  No results found for: HXT056  No results found for: CA2729  No components found for: HGQUANT  No results found for: CEA1 / No results found for: CEA1   No results found for: AFPTUMOR  No results found for:  CHROMOGRNA  No results found for: KPAFRELGTCHN, LAMBDASER, KAPLAMBRATIO (kappa/lambda light chains)  No results found for: HGBA, HGBA2QUANT, HGBFQUANT, HGBSQUAN (Hemoglobinopathy evaluation)   No results found for: LDH  No results found for: IRON, TIBC, IRONPCTSAT (Iron and TIBC)  No results found for: FERRITIN  Urinalysis No results found for: COLORURINE, APPEARANCEUR, LABSPEC, PHURINE, GLUCOSEU, HGBUR, BILIRUBINUR, KETONESUR, PROTEINUR, UROBILINOGEN, NITRITE, LEUKOCYTESUR   STUDIES: No results found.   ELIGIBLE FOR AVAILABLE RESEARCH PROTOCOL: no  ASSESSMENT: 79 y.o. [place] woman status post right breast outer quadrant biopsy 11/17/2018 showing a complex sclerosing lesion  (1) right upper outer quadrant lumpectomy (no sentinel lymph node sampling) 01/05/2019 showed a pT1a NX, stage IA invasive ductal carcinoma, grade 1, estrogen and progesterone receptor strongly positive, HER-2 not amplified, with an MIB-1 of 5%  (2) adjuvant radiation 03/29/2019 - 04/16/2019 Site Technique Total Dose (Gy) Dose per Fx (Gy) Completed Fx Beam Energies  Breast, Right: Breast_Rt_Ax 3D 40.05/40.05 2.67 15/15 6X, 10X   (3) discussed antiestrogens 03/09/2019: Patient opted against   PLAN: Sheri Shaw did well with her radiation and of course she also did well with her surgery earlier.  Her breast cancer was extremely early and she understands for T1 a tumors antiestrogens are optional.  She has decided against them and she will have only observation and follow-up.  We spent most of the visit today going over her osteoporosis.  She understands where she falls in the osteoporosis spectrum.  She is already on vitamin D and taking walks.  Certainly she could do more walking but I think she would eventually need pharmacologic agent and today we discussed all the bisphosphonates and denosumab in detail.  She has a good understanding of the possible benefits as well as the possible toxicities side effects and  complications.  At this point she would like to think about it a little more and she has all the information in writing so if she decides to try something she will let me or Dr. Caryl Comes her primary care physician know.  He will be seeing her in August.  I will see her again in February of next year and yearly thereafter until she completes her 5 years of follow-up.  I have entered the order for her next mammogram which will be at [facility] in September  After researching her family history more she does have 2 maternal aunts with breast and what sounds like primary peritoneal cancer.  I have set her up for genetics testing.  She knows to call for any other issue that may develop before the next visit.  Total encounter time 30 minutes.   Chauncey Cruel, MD  07/08/2019 12:13 PM Medical Oncology and Hematology [facility] [place], [place] [address] Tel. [phone]    Fax. [phone]   This document serves as a record of services personally performed by Lurline Del, MD. It was created on his behalf by Wilburn Mylar, a trained medical scribe. The creation of this record is based on the scribe's personal observations and the provider's statements to them.   I, Lurline Del MD, have reviewed the above documentation for accuracy and completeness, and I agree with the above.   *Total Encounter Time as defined by the Centers for Medicare and Medicaid Services includes, in addition to the face-to-face time of a patient visit (documented in the note above) non-face-to-face time: obtaining and reviewing outside history, ordering and reviewing medications, tests or procedures, care coordination (communications with other health care professionals or caregivers) and documentation in the medical record.

## 2019-07-08 ENCOUNTER — Other Ambulatory Visit: Payer: Self-pay

## 2019-07-08 ENCOUNTER — Encounter (INDEPENDENT_AMBULATORY_CARE_PROVIDER_SITE_OTHER): Payer: Self-pay

## 2019-07-08 ENCOUNTER — Inpatient Hospital Stay: Payer: Medicare Other | Attending: Oncology | Admitting: Oncology

## 2019-07-08 VITALS — BP 137/64 | HR 72 | Temp 98.3°F | Resp 18 | Ht 67.32 in | Wt 114.1 lb

## 2019-07-08 DIAGNOSIS — Z170 Estrogen receptor positive status [ER+]: Secondary | ICD-10-CM | POA: Insufficient documentation

## 2019-07-08 DIAGNOSIS — Z803 Family history of malignant neoplasm of breast: Secondary | ICD-10-CM | POA: Insufficient documentation

## 2019-07-08 DIAGNOSIS — Z923 Personal history of irradiation: Secondary | ICD-10-CM | POA: Diagnosis not present

## 2019-07-08 DIAGNOSIS — C50411 Malignant neoplasm of upper-outer quadrant of right female breast: Secondary | ICD-10-CM | POA: Diagnosis present

## 2019-07-08 DIAGNOSIS — M810 Age-related osteoporosis without current pathological fracture: Secondary | ICD-10-CM | POA: Diagnosis not present

## 2019-07-22 ENCOUNTER — Inpatient Hospital Stay (HOSPITAL_BASED_OUTPATIENT_CLINIC_OR_DEPARTMENT_OTHER): Payer: Medicare Other | Admitting: Genetic Counselor

## 2019-07-22 ENCOUNTER — Other Ambulatory Visit: Payer: Self-pay

## 2019-07-22 ENCOUNTER — Inpatient Hospital Stay: Payer: Medicare Other

## 2019-07-22 ENCOUNTER — Other Ambulatory Visit: Payer: Self-pay | Admitting: Genetic Counselor

## 2019-07-22 DIAGNOSIS — C50411 Malignant neoplasm of upper-outer quadrant of right female breast: Secondary | ICD-10-CM

## 2019-07-22 DIAGNOSIS — Z170 Estrogen receptor positive status [ER+]: Secondary | ICD-10-CM

## 2019-07-22 DIAGNOSIS — Z803 Family history of malignant neoplasm of breast: Secondary | ICD-10-CM | POA: Diagnosis not present

## 2019-07-22 DIAGNOSIS — Z800 Family history of malignant neoplasm of digestive organs: Secondary | ICD-10-CM | POA: Diagnosis not present

## 2019-07-22 DIAGNOSIS — Z807 Family history of other malignant neoplasms of lymphoid, hematopoietic and related tissues: Secondary | ICD-10-CM | POA: Diagnosis not present

## 2019-07-22 DIAGNOSIS — Z808 Family history of malignant neoplasm of other organs or systems: Secondary | ICD-10-CM | POA: Diagnosis not present

## 2019-07-22 LAB — GENETIC SCREENING ORDER

## 2019-07-23 ENCOUNTER — Encounter: Payer: Self-pay | Admitting: Genetic Counselor

## 2019-07-23 DIAGNOSIS — Z800 Family history of malignant neoplasm of digestive organs: Secondary | ICD-10-CM | POA: Insufficient documentation

## 2019-07-23 DIAGNOSIS — Z808 Family history of malignant neoplasm of other organs or systems: Secondary | ICD-10-CM | POA: Insufficient documentation

## 2019-07-23 DIAGNOSIS — Z807 Family history of other malignant neoplasms of lymphoid, hematopoietic and related tissues: Secondary | ICD-10-CM | POA: Insufficient documentation

## 2019-07-23 DIAGNOSIS — Z803 Family history of malignant neoplasm of breast: Secondary | ICD-10-CM | POA: Insufficient documentation

## 2019-07-23 NOTE — Progress Notes (Signed)
REFERRING PROVIDER: Chauncey Cruel, MD [address] [place],  [place] [address]  PRIMARY PROVIDER:  Adin Hector, MD  PRIMARY REASON FOR VISIT:  1. Carcinoma of upper-outer quadrant of right breast in female, estrogen receptor positive ([place])   2. Family history of breast cancer   3. Family history of peritoneal cancer   4. Family history of lymphoma   5. Family history of esophageal cancer      HISTORY OF PRESENT ILLNESS:   Sheri Shaw, a 79 y.o. female, was seen for a [facility] cancer genetics consultation at the request of Dr. Jana Hakim due to a personal and family history of cancer.  Sheri Shaw presents to clinic today to discuss the possibility of a hereditary predisposition to cancer, genetic testing, and to further clarify her future cancer risks, as well as potential cancer risks for family members.   In 2020, at the age of 89, Sheri Shaw was diagnosed with invasive ductal carcinoma, ER+/PR+/Her2-, of the right breast. The treatment plan included surgery (lumpectomy completed 01/05/2019) and adjuvant radiation.    CANCER HISTORY:  Oncology History  Carcinoma of upper-outer quadrant of right breast in female, estrogen receptor positive ([place])  02/09/2019 Cancer Staging   Staging form: Breast, AJCC 8th Edition - Pathologic stage from 02/09/2019: Stage Unknown (pT1a, pNX, cM0, G1, ER+, PR+, HER2-) - Signed by Eppie Gibson, MD on 02/10/2019   02/10/2019 Initial Diagnosis   Carcinoma of upper-outer quadrant of right breast in female, estrogen receptor positive ([place])      RISK FACTORS:  Menarche was at age 60.  First live birth at age 68.  OCP use for approximately 4 years.  Ovaries intact: yes.  Hysterectomy: no.  Menopausal status: postmenopausal.  HRT use: 2-3 years. Colonoscopy: yes; normal, per patient. Mammogram within the last year: yes. Number of breast biopsies: 1. Any excessive radiation exposure in the past: no   Past Medical History:   Diagnosis Date  . Family history of breast cancer   . Family history of esophageal cancer   . Family history of lymphoma   . Family history of peritoneal cancer   . Hypercholesteremia   . Radial scar of right breast     Past Surgical History:  Procedure Laterality Date  . APPENDECTOMY    . BREAST BIOPSY Right 11/17/2018   affirm bx  ribbon marker, path pending  . COLON SURGERY  2005   for diverticulitis  . RADIOACTIVE SEED GUIDED EXCISIONAL BREAST BIOPSY Right 01/05/2019   Procedure: RADIOACTIVE SEED GUIDED EXCISIONAL RIGHT BREAST BIOPSY;  Surgeon: Rolm Bookbinder, MD;  Location: [facility];  Service: General;  Laterality: Right;    Social History   Socioeconomic History  . Marital status: Married    Spouse name: Not on file  . Number of children: Not on file  . Years of education: Not on file  . Highest education level: Not on file  Occupational History  . Not on file  Tobacco Use  . Smoking status: Never Smoker  . Smokeless tobacco: Never Used  Substance and Sexual Activity  . Alcohol use: Never  . Drug use: Never  . Sexual activity: Not on file  Other Topics Concern  . Not on file  Social History Narrative  . Not on file   Social Determinants of Health   Financial Resource Strain:   . Difficulty of Paying Living Expenses:   Food Insecurity:   . Worried About Charity fundraiser in the Last  Year:   . Ran Out of Food in the Last Year:   Transportation Needs:   . Film/video editor (Medical):   Marland Kitchen Lack of Transportation (Non-Medical):   Physical Activity:   . Days of Exercise per Week:   . Minutes of Exercise per Session:   Stress:   . Feeling of Stress :   Social Connections:   . Frequency of Communication with Friends and Family:   . Frequency of Social Gatherings with Friends and Family:   . Attends Religious Services:   . Active Member of Clubs or Organizations:   . Attends Archivist Meetings:   Marland Kitchen Marital Status:       FAMILY HISTORY:  We obtained a detailed, 4-generation family history.  Significant diagnoses are listed below: Family History  Problem Relation Age of Onset  . Breast cancer Maternal Aunt        dx. in her 15s  . Lung cancer Maternal Uncle        dx. in his 63s  . COPD Mother   . Heart Problems Maternal Grandmother   . Pneumonia Maternal Grandfather   . Cancer Maternal Aunt        primary peritoneal cancer, dx. in her 67s  . Heart attack Maternal Aunt 51  . Hodgkin's lymphoma Maternal Uncle 36  . Breast cancer Cousin 58       maternal cousin  . Esophageal cancer Cousin 76       maternal cousin  . Lymphoma Cousin        maternal cousin   Sheri Shaw has one son (age 57) and one daughter (age 92). Neither have had cancer. She has no siblings.  Sheri Shaw mother died at the age of 67 with COPD, and did not have cancer. Sheri Shaw had four maternal uncles and three maternal aunts. One aunt had breast cancer in her 58s, and another aunt had primary peritoneal cancer in her 82s. One uncle died from Hodgkin's lymphoma at the age of 43, and another uncle died from lung cancer in his 42s. She has a maternal cousin who had breast cancer at the age of 62, a cousin who died from esophageal cancer at the age of 58, and another cousin who died from [place] in his 64s. Sheri Shaw maternal grandmother died at the age of 72 from heart problems, and her maternal grandfather died at the age of 65 from pneumonia.  Sheri Shaw father died in his 41s, possibly from a stroke, and did not have cancer. Sheri Shaw had two paternal aunts and three paternal uncles. She has limited information about these relatives. She has a paternal cousin who had breast cancer at the age of 63. Her paternal grandmother died in her 70s and her grandfather died in his early 48s.  Sheri Shaw is unaware of previous family history of genetic testing for hereditary cancer risks. Patient's maternal ancestors are of [place],  [place], and [place] descent, and paternal ancestors are of English descent. There is no reported Ashkenazi Jewish ancestry. There is no known consanguinity.  GENETIC COUNSELING ASSESSMENT: Sheri Shaw is a 79 y.o. female with a personal history of breast cancer and a family history of breast cancer and primary peritoneal cancer, which is somewhat suggestive of a hereditary cancer syndrome and predisposition to cancer. We, therefore, discussed and recommended the following at today's visit.   DISCUSSION: We discussed that approximately 5-10% of breast cancer is hereditary, with most cases associated with the BRCA1 and  BRCA2 genes. There are other genes that can be associated with hereditary breast cancer syndromes. These include ATM, CHEK2, PALB2, etc. We discussed that testing is beneficial for several reasons, including knowing about other cancer risks, identifying potential screening and risk-reduction options that may be appropriate, and to understand if other family members could be at risk for cancer and allow them to undergo genetic testing.   We reviewed the characteristics, features and inheritance patterns of hereditary cancer syndromes. We also discussed genetic testing, including the appropriate family members to test, the process of testing, insurance coverage and turn-around-time for results. We discussed the implications of a negative, positive and/or variant of uncertain significant result. We recommended Sheri Shaw pursue genetic testing for the [facility] Breast and Gyn Cancers Panel.   The Breast and Gyn Cancers Panel offered by [facility] includes sequencing and deletion/duplication analysis of the following 23 genes:  ATM, BARD1, BRCA1, BRCA2, BRIP1, CDH1, CHEK2, DICER1, EPCAM, MLH1, MSH2, MSH6, NBN, NF1, PALB2, PMS2, PTEN, RAD50, RAD51C, RAD51D, SMARCA4, STK11, and TP53.  Based on Sheri Shaw's personal and family history of cancer, she meets medical criteria for genetic testing. Despite that  she meets criteria, she may still have an out of pocket cost. We discussed that if her out of pocket cost for testing is over $100, the laboratory will reach out to let her know. If the out of pocket cost of testing is less than $100 she will be billed by the genetic testing laboratory.   PLAN: After considering the risks, benefits, and limitations, Sheri Shaw provided informed consent to pursue genetic testing and the blood sample was sent to [facility] for analysis of the Breast and Gyn Cancers Panel. Results should be available within approximately two-three weeks' time, at which point they will be disclosed by telephone to Sheri Shaw, as will any additional recommendations warranted by these results. Sheri Shaw will receive a summary of her genetic counseling visit and a copy of her results once available. This information will also be available in Epic.   Sheri Shaw questions were answered to her satisfaction today. Our contact information was provided should additional questions or concerns arise. Thank you for the referral and allowing [place] to share in the care of your patient.   Clint Guy, [employer], [facility] Licensed, Certified Dispensing [email or web address] Phone: [phone]  The patient was seen for a total of 40 minutes in face-to-face genetic counseling.  This patient was discussed with Drs. Magrinat, Lindi Adie and/or Burr Medico who agrees with the above.    _______________________________________________________________________ For Office Staff:  Number of people involved in session: 1 Was an Intern/ student involved with case: no

## 2019-08-12 ENCOUNTER — Telehealth: Payer: Self-pay | Admitting: Genetic Counselor

## 2019-08-12 NOTE — Telephone Encounter (Signed)
Ms. Kretz called to check on the status of her genetic testing. We do not have her genetic test results yet. Upon further review, it appears that the genetic testing laboratory ([facility]) may not have received her blood sample. We will reach out to [facility] to determine if they have her sample, or if we will need to have Sheri Shaw's blood redrawn. We will call Ms. Diamond back once we hear back from [facility].

## 2019-08-12 NOTE — Telephone Encounter (Signed)
Called Ms. Corral back with updates from the genetic testing laboratory - they have received her sample and will begin the test process now. We should have her results within two-three weeks, at which point we will give her a call to review them with her.

## 2019-09-03 ENCOUNTER — Encounter: Payer: Self-pay | Admitting: Genetic Counselor

## 2019-09-03 ENCOUNTER — Telehealth: Payer: Self-pay | Admitting: Genetic Counselor

## 2019-09-03 ENCOUNTER — Ambulatory Visit: Payer: Self-pay | Admitting: Genetic Counselor

## 2019-09-03 DIAGNOSIS — Z1379 Encounter for other screening for genetic and chromosomal anomalies: Secondary | ICD-10-CM | POA: Insufficient documentation

## 2019-09-03 NOTE — Telephone Encounter (Signed)
Revealed negative genetic testing. Discussed that we do not know why she has breast cancer or why there is cancer in the family. There could be a genetic mutation in the family that Sheri Shaw did not inherit. There could also be a mutation in a different gene that we are not testing, or our current technology may not be able detect certain mutations. It will therefore be important for her to stay in contact with genetics to keep up with whether additional testing may be appropriate in the future.

## 2019-09-03 NOTE — Telephone Encounter (Signed)
LVM that her genetic test results are available and requested that she call back to discuss them.  

## 2019-09-03 NOTE — Progress Notes (Signed)
HPI:  Ms. Dolin was previously seen in the [facility] due to a personal and family history of cancer and concerns regarding a hereditary predisposition to cancer. Please refer to our prior cancer genetics clinic note for more information regarding our discussion, assessment and recommendations, at the time. Ms. Letendre recent genetic test results were disclosed to her, as were recommendations warranted by these results. These results and recommendations are discussed in more detail below.  CANCER HISTORY:  Oncology History  Carcinoma of upper-outer quadrant of right breast in female, estrogen receptor positive ([place])  02/09/2019 Cancer Staging   Staging form: Breast, AJCC 8th Edition - Pathologic stage from 02/09/2019: Stage Unknown (pT1a, pNX, cM0, G1, ER+, PR+, HER2-) - Signed by Eppie Gibson, MD on 02/10/2019   02/10/2019 Initial Diagnosis   Carcinoma of upper-outer quadrant of right breast in female, estrogen receptor positive ([place])   09/03/2019 Genetic Testing   Negative genetic testing:  No pathogenic variants detected on the [facility] Breast [facility] G[facility] Cancers Panel. The report date is 09/03/2019.  The Breast and Gyn Cancers Panel offered by [facility] includes sequencing and deletion/duplication analysis of the following 23 genes:  ATM, BARD1, BRCA1, BRCA2, BRIP1, CDH1, CHEK2, DICER1, EPCAM, MLH1, MSH2, MSH6, NBN, NF1, PALB2, PMS2, PTEN, RAD50, RAD51C, RAD51D, SMARCA4, STK11, and TP53.     FAMILY HISTORY:  We obtained a detailed, 4-generation family history.  Significant diagnoses are listed below: Family History  Problem Relation Age of Onset  . Breast cancer Maternal Aunt        dx. in her 39s  . Lung cancer Maternal Uncle        dx. in his 61s  . COPD Mother   . Heart Problems Maternal Grandmother   . Pneumonia Maternal Grandfather   . Cancer Maternal Aunt        primary peritoneal cancer, dx. in her 109s  . Heart attack Maternal Aunt 51  . Hodgkin's lymphoma  Maternal Uncle 36  . Breast cancer Cousin 73       maternal cousin  . Esophageal cancer Cousin 65       maternal cousin  . Lymphoma Cousin        maternal cousin   Ms. Moroney has one son (age 90) and one daughter (age 13). Neither have had cancer. She has no siblings.  Ms. Art mother died at the age of 56 with COPD, and did not have cancer. Ms. Bortle had four maternal uncles and three maternal aunts. One aunt had breast cancer in her 35s, and another aunt had primary peritoneal cancer in her 47s. One uncle died from Hodgkin's lymphoma at the age of 34, and another uncle died from lung cancer in his 59s. She has a maternal cousin who had breast cancer at the age of 74, a cousin who died from esophageal cancer at the age of 79, and another cousin who died from [place] in his 59s. Ms. Correnti maternal grandmother died at the age of 19 from heart problems, and her maternal grandfather died at the age of 5 from pneumonia.  Ms. Degrasse father died in his 73s, possibly from a stroke, and did not have cancer. Ms. Clonch had two paternal aunts and three paternal uncles. She has limited information about these relatives. She has a paternal cousin who had breast cancer at the age of 28. Her paternal grandmother died in her 78s and her grandfather died in his early 74s.  Ms. Asa is unaware of previous  family history of genetic testing for hereditary cancer risks. Patient's maternal ancestors are of [place], [place], and [place] descent, and paternal ancestors are of English descent. There is no reported Ashkenazi Jewish ancestry. There is no known consanguinity.  GENETIC TEST RESULTS: Genetic testing reported out on 09/03/2019 through the [facility] Breast and Gyn Cancers panel. No pathogenic variants were detected.   The Breast and Gyn Cancers Panel offered by [facility] includes sequencing and deletion/duplication analysis of the following 23 genes:  ATM, BARD1, BRCA1, BRCA2, BRIP1, CDH1, CHEK2,  DICER1, EPCAM, MLH1, MSH2, MSH6, NBN, NF1, PALB2, PMS2, PTEN, RAD50, RAD51C, RAD51D, SMARCA4, STK11, and TP53.. The test report will be scanned into EPIC and located under the Molecular Pathology section of the Results Review tab.  A portion of the result report is included below for reference.     We discussed with Ms. Murrill that because current genetic testing is not perfect, it is possible there may be a gene mutation in one of these genes that current testing cannot detect, but that chance is small.  We also discussed that there could be another gene that has not yet been discovered, or that we have not yet tested, that is responsible for the cancer diagnoses in the family. It is also possible there is a hereditary cause for the cancer in the family that Ms. Ivens did not inherit and therefore was not identified in her testing.  Therefore, it is important to remain in touch with cancer genetics in the future so that we can continue to offer Ms. Fitzwater the most up to date genetic testing.   CANCER SCREENING RECOMMENDATIONS: Ms. Morgenthaler test result is considered negative (normal).  This means that we have not identified a hereditary cause for her personal and family history of cancer at this time. Most cancers happen by chance and this negative test suggests that her personal and family of cancer may fall into this category.    While reassuring, this does not definitively rule out a hereditary predisposition to cancer. It is still possible that there could be genetic mutations that are undetectable by current technology. There could be genetic mutations in genes that have not been tested or identified to increase cancer risk.  Therefore, it is recommended she continue to follow the cancer management and screening guidelines provided by her oncology and primary healthcare providers.   An individual's cancer risk and medical management are not determined by genetic test results alone. Overall cancer  risk assessment incorporates additional factors, including personal medical history, family history, and any available genetic information that may result in a personalized plan for cancer prevention and surveillance.  RECOMMENDATIONS FOR FAMILY MEMBERS:  Individuals in this family might be at some increased risk of developing cancer, over the general population risk, simply due to the family history of cancer.  We recommended women in this family have a yearly mammogram beginning at age 34, or 79 years younger than the earliest onset of cancer, an annual clinical breast exam, and perform monthly breast self-exams. Women in this family should also have a gynecological exam as recommended by their primary provider. All family members should be referred for colonoscopy starting at age 71.  It is also possible there is a hereditary cause for the cancer in Ms. Pollak's family that she did not inherit and therefore was not identified in her.  Based on Ms. Fuhriman's family history, we recommended her maternal cousin, who was diagnosed with breast cancer at age 36, have genetic counseling  and testing. Ms. Crittendon will let us know if we can be of any assistance in coordinating genetic counseling and/or testing for this family member.   FOLLOW-UP: Lastly, we discussed with Ms. Panzer that cancer genetics is a rapidly advancing field and it is possible that new genetic tests will be appropriate for her and/or her family members in the future. We encouraged her to remain in contact with cancer genetics on an annual basis so we can update her personal and family histories and let her know of advances in cancer genetics that may benefit this family.   Our contact number was provided. Ms. Neal questions were answered to her satisfaction, and she knows she is welcome to call us at anytime with additional questions or concerns.   Clint Guy, MS, [facility] Genetic Counselor [email or web address] Phone:  [phone]

## 2019-11-22 ENCOUNTER — Other Ambulatory Visit: Payer: Self-pay | Admitting: Oncology

## 2019-11-22 ENCOUNTER — Other Ambulatory Visit: Payer: Self-pay | Admitting: *Deleted

## 2019-11-22 DIAGNOSIS — Z853 Personal history of malignant neoplasm of breast: Secondary | ICD-10-CM

## 2019-12-01 ENCOUNTER — Other Ambulatory Visit: Payer: Self-pay

## 2019-12-01 ENCOUNTER — Ambulatory Visit
Admission: RE | Admit: 2019-12-01 | Discharge: 2019-12-01 | Disposition: A | Discharge status: Home / Self Care | Payer: Medicare Other | Source: Ambulatory Visit | Attending: Oncology | Admitting: Oncology

## 2019-12-01 DIAGNOSIS — Z853 Personal history of malignant neoplasm of breast: Secondary | ICD-10-CM | POA: Insufficient documentation

## 2019-12-01 DIAGNOSIS — C50411 Malignant neoplasm of upper-outer quadrant of right female breast: Secondary | ICD-10-CM | POA: Insufficient documentation

## 2019-12-01 DIAGNOSIS — Z170 Estrogen receptor positive status [ER+]: Secondary | ICD-10-CM | POA: Insufficient documentation

## 2020-01-14 ENCOUNTER — Ambulatory Visit: Payer: Medicare Other

## 2020-03-08 ENCOUNTER — Other Ambulatory Visit: Payer: Self-pay | Admitting: Oncology

## 2020-03-09 ENCOUNTER — Telehealth: Payer: Self-pay | Admitting: Oncology

## 2020-03-09 NOTE — Telephone Encounter (Signed)
Scheduled appt per 1/12 sch msg - pt is aware of new appt date and time

## 2020-03-29 ENCOUNTER — Ambulatory Visit: Payer: Medicare Other | Admitting: Oncology

## 2020-03-29 ENCOUNTER — Other Ambulatory Visit: Payer: Medicare Other

## 2020-04-12 NOTE — Progress Notes (Signed)
[facility]  Telephone:([phone]) [phone] Fax:([phone]) [phone]     ID: Sheri Shaw DOB: 1941/01/06  MR#: [id number]  HBZ#:[id number]  Patient Care Team: Adin Hector, MD as PCP - General (Internal Medicine) Mauro Kaufmann, RN as Oncology Nurse Navigator Rockwell Germany, RN as Oncology Nurse Navigator Eppie Gibson, MD as Consulting Physician (Radiation Oncology) Icela Glymph, Virgie Dad, MD as Consulting Physician (Oncology) Rolm Bookbinder, MD as Consulting Physician (General Surgery) Chauncey Cruel, MD OTHER MD:  CHIEF COMPLAINT: Invasive ductal carcinoma  CURRENT TREATMENT: Observation   INTERVAL HISTORY: [name] returns today for follow up of her estrogen receptor positive breast cancer. She continues under observation.  Since her last visit, she underwent genetic testing on 07/22/2019. Results were negative.  She also underwent bilateral diagnostic mammography with tomography at [facility] on 12/01/2019 showing: breast density category C; no evidence of malignancy in either breast.    REVIEW OF SYSTEMS: [name] is even more active than before and she took are advised to walk regularly at 2 heart.  She walks between 20 and 30 minutes 5 days a week.  If the weather is horrible she walks inside.  For a while she developed a little bit of right knee discomfort but that has resolved.  Of course she is taking appropriate pandemic precautions and mostly [place].  She has grandchildren at [place] and at [facility].  A detailed review of systems today was otherwise stable   COVID 19 VACCINATION STATUS: [place] x2, with booster August 2020   HISTORY OF CURRENT ILLNESS: From the original intake note:  Sheri Shaw had routine screening mammography on 10/21/2018 showing a possible abnormality in the right breast. She underwent right diagnostic mammography with tomography and right breast  ultrasonography at [facility] on 11/05/2018 showing: breast density category C; right breast architectural distortion at approximately 11 o'clock without sonographic correlate; right axilla not evaluated.  Accordingly on 11/17/2018 she proceeded to biopsy of the right breast area in question. The pathology from this procedure ([id number]) showed: a small complex sclerosing lesion/radial scar; fibrocystic changes and few ductal microcalcifications; focal duct ectasia and inflammation; negative for atypia and malignancy.   Accordingly she was referred to surgery and underwent right lumpectomy on 01/05/2019 under Dr. Donne Hazel. 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 Prognostic indicators significant for: estrogen receptor, 95% positive and progesterone receptor, 95% positive, both with strong staining intensity. Proliferation marker Ki67 at 5%. HER2 negative by immunohistochemistry (1+).  The patient's subsequent history is as detailed below.   PAST MEDICAL HISTORY: Past Medical History:  Diagnosis Date   Family history of breast cancer    Family history of esophageal cancer    Family history of lymphoma    Family history of peritoneal cancer    Hypercholesteremia    Radial scar of right breast     PAST SURGICAL HISTORY: Past Surgical History:  Procedure Laterality Date   APPENDECTOMY     BREAST BIOPSY Right 11/17/2018   affirm bx  ribbon marker complex sclerosing lesion    BREAST LUMPECTOMY Right 01/04/2019   ,Invasive ductal carcinoma, Nottingham grade 1 of 3, 0.4 cm arising in a complex 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, 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 (0.4 cm; medial margin  COLON SURGERY  2005   for diverticulitis    RADIOACTIVE SEED GUIDED EXCISIONAL BREAST BIOPSY Right 01/05/2019   Procedure: RADIOACTIVE SEED GUIDED EXCISIONAL RIGHT BREAST BIOPSY;  Surgeon: Rolm Bookbinder, MD;  Location: [facility];  Service: General;  Laterality: Right;    FAMILY HISTORY: Family History  Problem Relation Age of Onset   Breast cancer Maternal Aunt        dx. in her 53s   Lung cancer Maternal Uncle        dx. in his 3s   COPD Mother    Heart Problems Maternal Grandmother    Pneumonia Maternal Grandfather    Cancer Maternal Aunt        primary peritoneal cancer, dx. in her 67s   Heart attack Maternal Aunt 51   Hodgkin's lymphoma Maternal Uncle 36   Breast cancer Cousin 75       maternal cousin   Esophageal cancer Cousin 7       maternal cousin   Lymphoma Cousin        maternal cousin  The patient's father died in his 20s following a stroke.  The patient's mother died from COPD at 69.  The patient had no brothers or sisters.  There is a maternal aunt who developed breast cancer in her 71s and one cousin with esophageal cancer.  There is no history of ovarian prostate or pancreatic cancers to the patient's knowledge   GYNECOLOGIC HISTORY:  No LMP recorded. Patient is postmenopausal. Menarche: 80 years old Age at first live birth: 80 years old [place] P 2 LMP around ages 49-55 Contraceptive: yes, previously used without issue HRT 2 or 3 years  Hysterectomy? no BSO?  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  Her husband [name]") worked as an Chief Financial Officer for [employer].  Daughter [name] is a Pharmacist, community here in [place] and son [name] is a Doctor, hospital and then [employer].  He practices OB/GYN in [place].  The patient has 5 grandchildren.  She is a Tourist information centre manager.    ADVANCED DIRECTIVES: In the absence of any documents to the contrary the patient's husband is  her healthcare power of attorney   HEALTH MAINTENANCE: Social History   Tobacco Use   Smoking status: Never Smoker   Smokeless tobacco: Never Used  Vaping Use   Vaping Use: Never used  Substance Use Topics   Alcohol use: Never   Drug use: Never     Colonoscopy: [facility], remote  PAP:   Bone density: 01/2019, -3.3   No Known Allergies  Current Outpatient Medications  Medication Sig Dispense Refill   Calcium Citrate-Vitamin D (CALCIUM + D PO) Take by mouth.     diazepam (VALIUM) 5 MG tablet Take 5 mg by mouth at bedtime as needed for anxiety. She takes 2.$RemoveBef'5mg'EkQJckusft$  nightly     Vitamin D, Cholecalciferol, 50 MCG (2000 UT) CAPS Take by mouth.     No current facility-administered medications for this visit.    OBJECTIVE: White woman in no acute distress  Vitals:   04/13/20 1331  BP: (!) 147/51  Pulse: (!) 58  Resp: 18  Temp: 97.9 F (36.6 C)  SpO2: 100%     Body mass index is 17.69 kg/m.   Wt Readings from Last 3 Encounters:  04/13/20 114 lb (51.7 kg)  07/08/19 114 lb 1.6 oz (51.8 kg)  03/09/19 113 lb 1.6 oz (51.3 kg)      ECOG FS:1 - Symptomatic but completely ambulatory  Sclerae unicteric, EOMs intact Wearing a mask No cervical or supraclavicular adenopathy Lungs no rales or rhonchi Heart regular rate and rhythm Abd soft, nontender, positive bowel sounds MSK no focal spinal tenderness, no upper extremity lymphedema Neuro: nonfocal, well oriented, appropriate affect Breasts: The right breast is status post lumpectomy and radiation.  There is no evidence of residual or recurrent disease.  The left breast is benign.  Both axillae are benign.   LAB RESULTS:  CMP     Component Value Date/Time   NA 132 (L) 04/13/2020 1306   K 4.5 04/13/2020 1306   CL 100 04/13/2020 1306   CO2 27 04/13/2020 1306   GLUCOSE 83 04/13/2020 1306   BUN 14 04/13/2020 1306   CREATININE 0.77 04/13/2020 1306   CREATININE 0.95 03/09/2019 1516   CALCIUM 9.8 04/13/2020 1306   PROT  6.7 04/13/2020 1306   ALBUMIN 3.8 04/13/2020 1306   AST 15 04/13/2020 1306   AST 16 03/09/2019 1516   ALT 9 04/13/2020 1306   ALT 11 03/09/2019 1516   ALKPHOS 45 04/13/2020 1306   BILITOT 0.3 04/13/2020 1306   BILITOT <0.2 (L) 03/09/2019 1516   GFRNONAA >60 04/13/2020 1306   GFRNONAA 58 (L) 03/09/2019 1516   GFRAA >60 03/09/2019 1516    No results found for: TOTALPROTELP, ALBUMINELP, A1GS, A2GS, BETS, BETA2SER, GAMS, MSPIKE, SPEI  Lab Results  Component Value Date   WBC 6.4 04/13/2020   NEUTROABS 3.5 04/13/2020   HGB 12.0 04/13/2020   HCT 36.9 04/13/2020   MCV 95.6 04/13/2020   PLT 214 04/13/2020    No results found for: LABCA2  No components found for: ZOXWRU045  No results for input(s): INR in the last 168 hours.  No results found for: LABCA2  No results found for: WUJ811  No results found for: BJY782  No results found for: NFA213  No results found for: CA2729  No components found for: HGQUANT  No results found for: CEA1 / No results found for: CEA1   No results found for: AFPTUMOR  No results found for: CHROMOGRNA  No results found for: KPAFRELGTCHN, LAMBDASER, KAPLAMBRATIO (kappa/lambda light chains)  No results found for: HGBA, HGBA2QUANT, HGBFQUANT, HGBSQUAN (Hemoglobinopathy evaluation)   No results found for: LDH  No results found for: IRON, TIBC, IRONPCTSAT (Iron and TIBC)  No results found for: FERRITIN  Urinalysis No results found for: COLORURINE, APPEARANCEUR, LABSPEC, PHURINE, GLUCOSEU, HGBUR, BILIRUBINUR, KETONESUR, PROTEINUR, UROBILINOGEN, NITRITE, LEUKOCYTESUR   STUDIES: No results found.   ELIGIBLE FOR AVAILABLE RESEARCH PROTOCOL: no  ASSESSMENT: 80 y.o. [place] woman status post right breast outer quadrant biopsy 11/17/2018 showing a complex sclerosing lesion  (1) right upper outer quadrant lumpectomy (no sentinel lymph node sampling) 01/05/2019 showed a pT1a NX, stage IA invasive ductal carcinoma, grade 1, estrogen and  progesterone receptor strongly positive, HER-2 not amplified, with an MIB-1 of 5%  (2) adjuvant radiation 03/29/2019 - 04/16/2019 Site Technique Total Dose (Gy) Dose per Fx (Gy) Completed Fx Beam Energies  Breast, Right: Breast_Rt_Ax 3D 40.05/40.05 2.67 15/15 6X, 10X   (3) discussed antiestrogens 03/09/2019: Patient opted against  (4) genetics testing 09/03/2019 through the [facility] [place] found no deleterious mutations in ATM, BARD1, BRCA1, BRCA2, BRIP1, CDH1, CHEK2, DICER1, EPCAM, MLH1, MSH2, MSH6, NBN, NF1, PALB2, PMS2, PTEN, RAD50, RAD51C, RAD51D, SMARCA4, STK11, and TP53.   PLAN: [name] will soon be  a year and a half out from definitive surgery for her breast cancer with no evidence of disease recurrence.  This is very favorable.  T1 a tumors in many ways are similar to ductal carcinoma in situ cases.  The risk of the tumor having spread outside the breast is so low that antiestrogens are optional. [name] has decided against antiestrogens and in her situation that is not unreasonable  She tells me her primary care physician sees her regularly and does a breast exam regularly as well.  All [name] needs as far as breast cancer follow-up is concerned is a physician breast exam and yearly mammography.  Accordingly I am comfortable releasing her to Dr. Olin Pia care at this point.  Of course I will be glad to see [name] again at any point in the future if and when the need arises but as of now are making no further routine appointments for her here.  Total encounter time 20 minutes.Chauncey Cruel, MD   04/13/2020 2:01 PM Medical Oncology and Hematology [facility] [place], [place] [address] Tel. [phone]    Fax. [phone]   This document serves as a record of services personally performed by Lurline Del, MD. It was created on his behalf by Wilburn Mylar, a trained medical scribe. The creation of this record is based on the scribe's  personal observations and the provider's statements to them.   I, Lurline Del MD, have reviewed the above documentation for accuracy and completeness, and I agree with the above.   *Total Encounter Time as defined by the Centers for Medicare and Medicaid Services includes, in addition to the face-to-face time of a patient visit (documented in the note above) non-face-to-face time: obtaining and reviewing outside history, ordering and reviewing medications, tests or procedures, care coordination (communications with other health care professionals or caregivers) and documentation in the medical record.

## 2020-04-13 ENCOUNTER — Inpatient Hospital Stay: Payer: Medicare Other | Attending: Oncology

## 2020-04-13 ENCOUNTER — Other Ambulatory Visit: Payer: Self-pay

## 2020-04-13 ENCOUNTER — Inpatient Hospital Stay (HOSPITAL_BASED_OUTPATIENT_CLINIC_OR_DEPARTMENT_OTHER): Payer: Medicare Other | Admitting: Oncology

## 2020-04-13 VITALS — BP 147/51 | HR 58 | Temp 97.9°F | Resp 18 | Ht 67.32 in | Wt 114.0 lb

## 2020-04-13 DIAGNOSIS — C50411 Malignant neoplasm of upper-outer quadrant of right female breast: Secondary | ICD-10-CM | POA: Diagnosis not present

## 2020-04-13 DIAGNOSIS — Z170 Estrogen receptor positive status [ER+]: Secondary | ICD-10-CM

## 2020-04-13 DIAGNOSIS — Z807 Family history of other malignant neoplasms of lymphoid, hematopoietic and related tissues: Secondary | ICD-10-CM | POA: Diagnosis not present

## 2020-04-13 DIAGNOSIS — M810 Age-related osteoporosis without current pathological fracture: Secondary | ICD-10-CM

## 2020-04-13 DIAGNOSIS — Z800 Family history of malignant neoplasm of digestive organs: Secondary | ICD-10-CM | POA: Insufficient documentation

## 2020-04-13 DIAGNOSIS — Z808 Family history of malignant neoplasm of other organs or systems: Secondary | ICD-10-CM | POA: Diagnosis not present

## 2020-04-13 DIAGNOSIS — Z803 Family history of malignant neoplasm of breast: Secondary | ICD-10-CM | POA: Diagnosis not present

## 2020-04-13 DIAGNOSIS — Z853 Personal history of malignant neoplasm of breast: Secondary | ICD-10-CM | POA: Diagnosis not present

## 2020-04-13 LAB — CBC WITH DIFFERENTIAL/PLATELET
Abs Immature Granulocytes: 0.01 10*3/uL (ref 0.00–0.07)
Basophils Absolute: 0.1 10*3/uL (ref 0.0–0.1)
Basophils Relative: 1 %
Eosinophils Absolute: 0.1 10*3/uL (ref 0.0–0.5)
Eosinophils Relative: 2 %
HCT: 36.9 % (ref 36.0–46.0)
Hemoglobin: 12 g/dL (ref 12.0–15.0)
Immature Granulocytes: 0 %
Lymphocytes Relative: 32 %
Lymphs Abs: 2 10*3/uL (ref 0.7–4.0)
MCH: 31.1 pg (ref 26.0–34.0)
MCHC: 32.5 g/dL (ref 30.0–36.0)
MCV: 95.6 fL (ref 80.0–100.0)
Monocytes Absolute: 0.7 10*3/uL (ref 0.1–1.0)
Monocytes Relative: 10 %
Neutro Abs: 3.5 10*3/uL (ref 1.7–7.7)
Neutrophils Relative %: 55 %
Platelets: 214 10*3/uL (ref 150–400)
RBC: 3.86 MIL/uL — ABNORMAL LOW (ref 3.87–5.11)
RDW: 12.8 % (ref 11.5–15.5)
WBC: 6.4 10*3/uL (ref 4.0–10.5)
nRBC: 0 % (ref 0.0–0.2)

## 2020-04-13 LAB — COMPREHENSIVE METABOLIC PANEL
ALT: 9 U/L (ref 0–44)
AST: 15 U/L (ref 15–41)
Albumin: 3.8 g/dL (ref 3.5–5.0)
Alkaline Phosphatase: 45 U/L (ref 38–126)
Anion gap: 5 (ref 5–15)
BUN: 14 mg/dL (ref 8–23)
CO2: 27 mmol/L (ref 22–32)
Calcium: 9.8 mg/dL (ref 8.9–10.3)
Chloride: 100 mmol/L (ref 98–111)
Creatinine, Ser: 0.77 mg/dL (ref 0.44–1.00)
GFR, Estimated: >60 mL/min (ref >60)
Glucose, Bld: 83 mg/dL (ref 70–99)
Potassium: 4.5 mmol/L (ref 3.5–5.1)
Sodium: 132 mmol/L — ABNORMAL LOW (ref 135–145)
Total Bilirubin: 0.3 mg/dL (ref 0.3–1.2)
Total Protein: 6.7 g/dL (ref 6.5–8.1)

## 2020-04-14 ENCOUNTER — Telehealth: Payer: Self-pay | Admitting: Oncology

## 2020-04-14 ENCOUNTER — Telehealth: Payer: Self-pay | Admitting: Hematology and Oncology

## 2020-04-14 NOTE — Telephone Encounter (Signed)
No 2/17 los. No changes made to pt's schedule.

## 2020-06-02 IMAGING — MG MM BREAST LOCALIZATION CLIP
4 series · 4 of 12 positions shown · non-contrast
Comparison: 11/05/2018 and earlier

CLINICAL DATA: Status post stereotactic guided core biopsy of
distortion in the UPPER-OUTER QUADRANT RIGHT breast.

EXAM:
DIAGNOSTIC RIGHT MAMMOGRAM POST STEREOTACTIC BIOPSY

[R CC synth-2D]
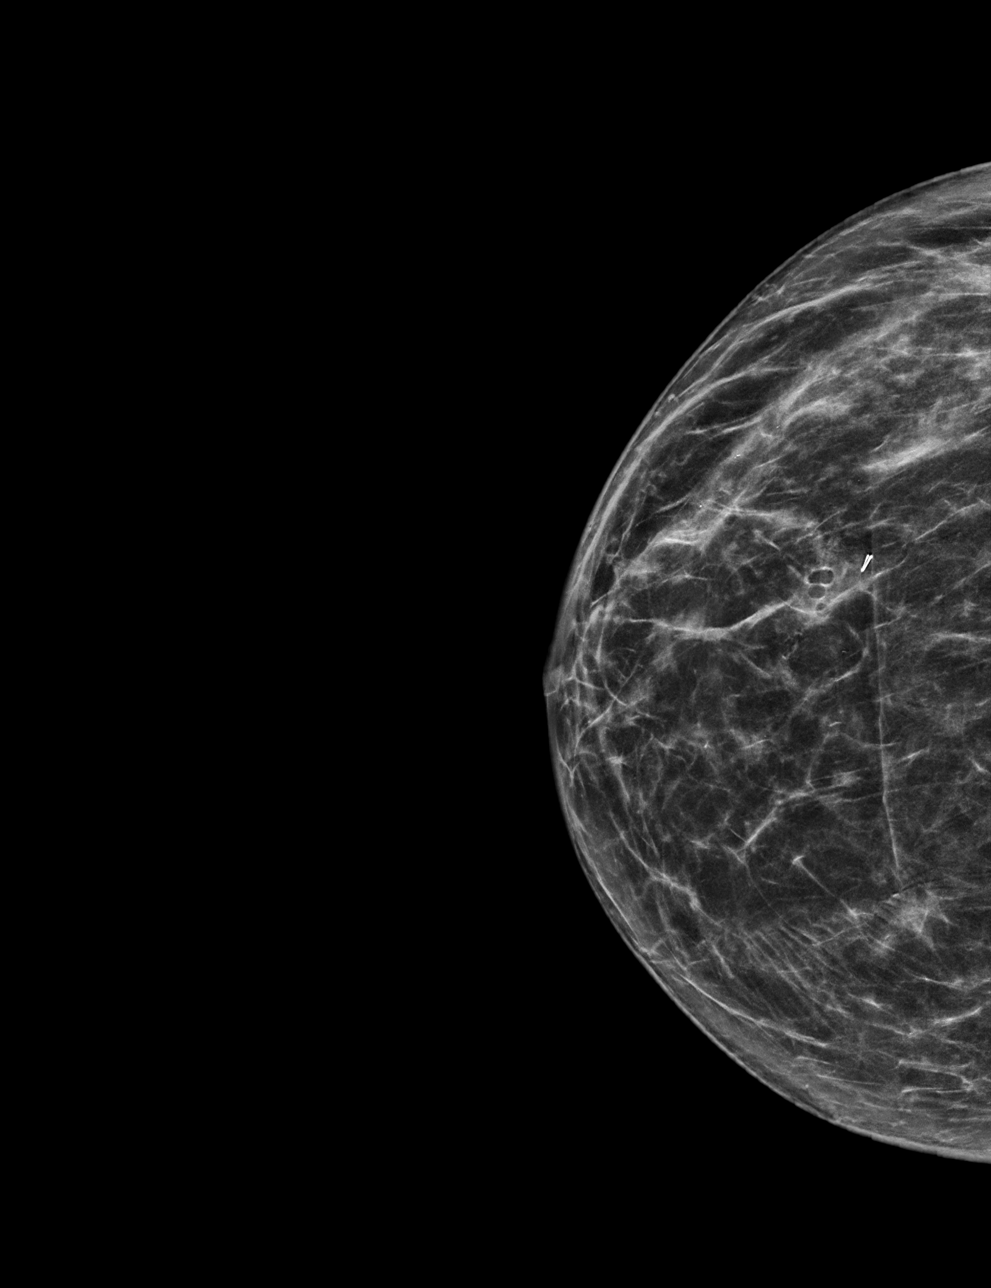

[R ML synth-2D]
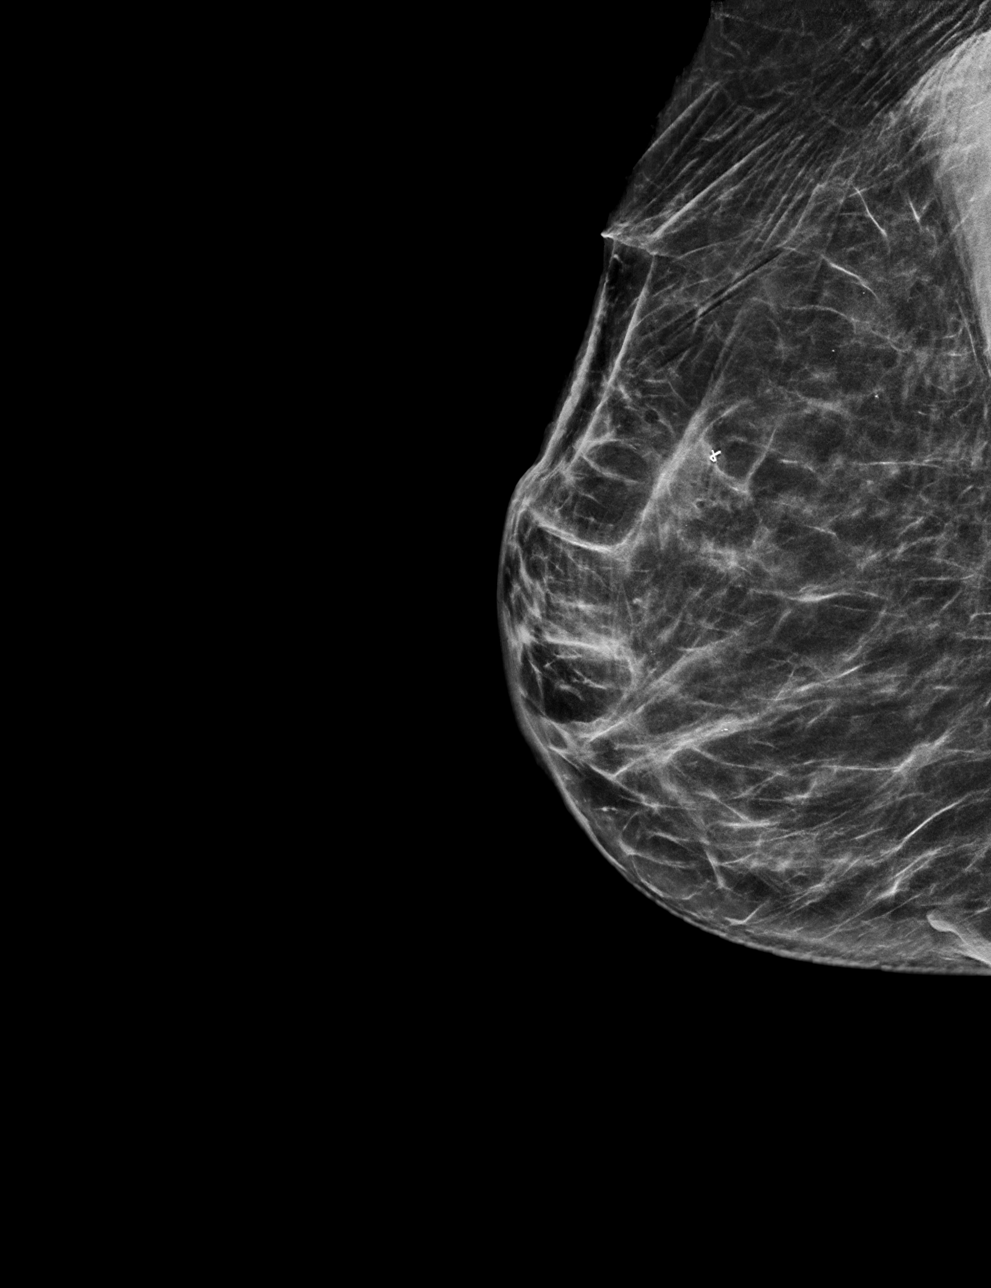

[R ML tomo · tomo slice 34/67.0]
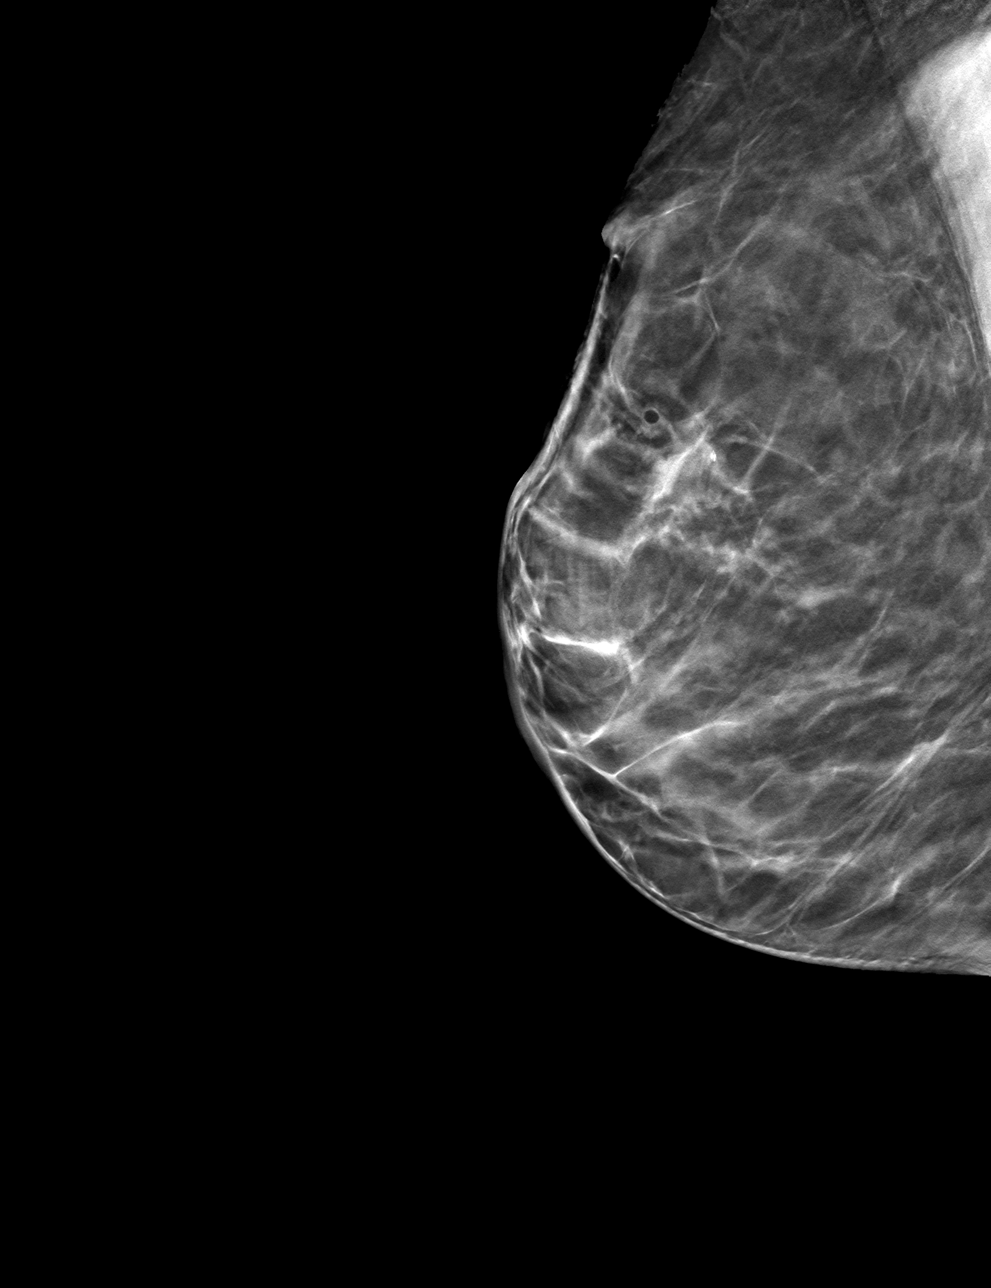

[R CC tomo · tomo slice 30/59.0]
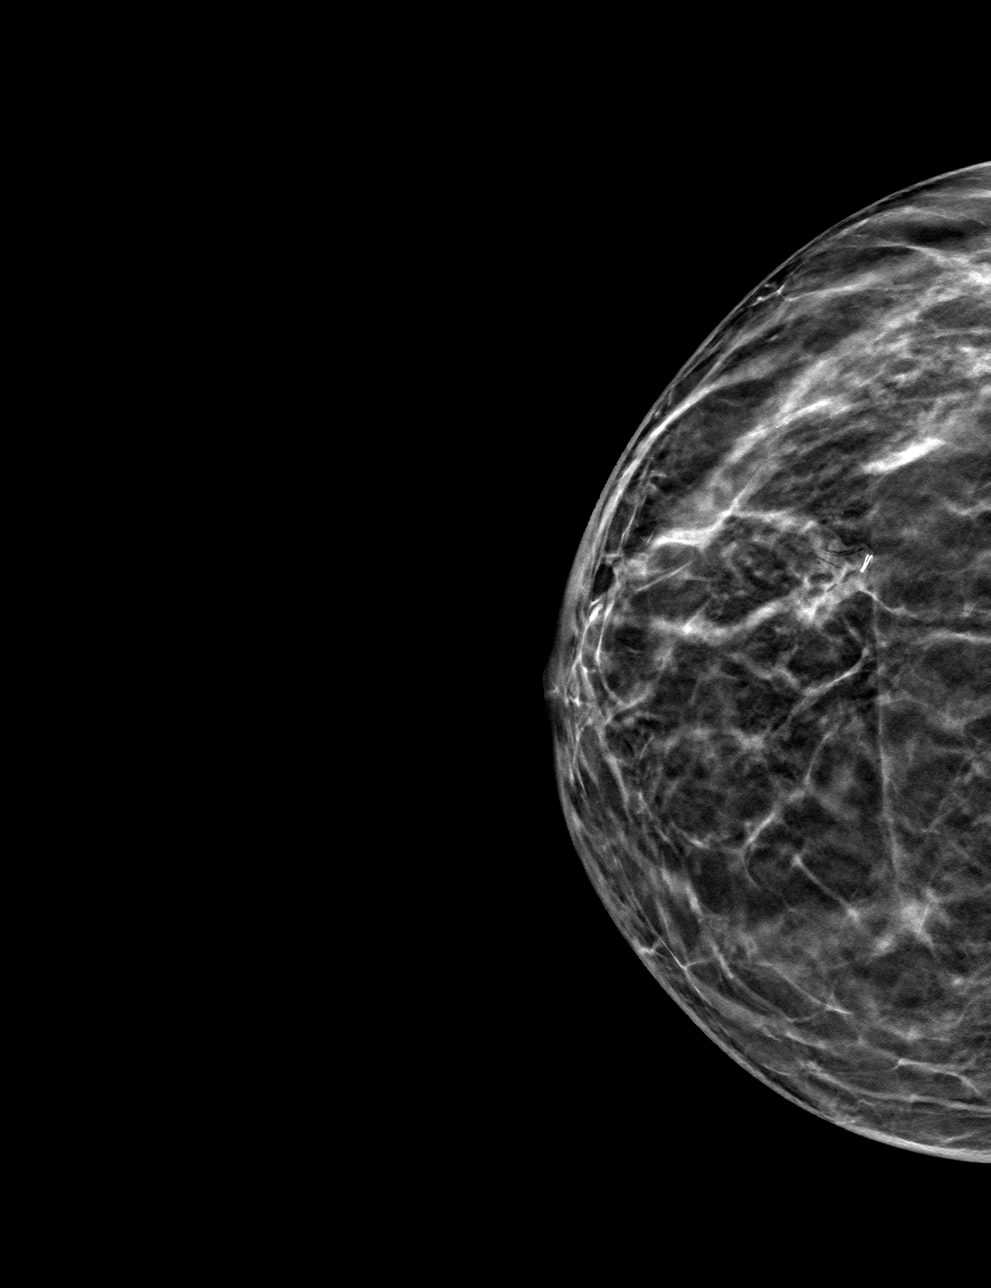

[4 of 12 positions shown; findings below may reference images not displayed]

FINDINGS: Mammographic images were obtained following stereotactic guided
biopsy of distortion in the UPPER-OUTER QUADRANT RIGHT breast and
placement of a ribbon shaped clip. The ribbon shaped clip is
centimeters above the central area of distortion.
IMPRESSION: Clip migration 1.8 centimeters above the area of distortion in the
UPPER-OUTER QUADRANT of the RIGHT breast.

Final Assessment: Post Procedure Mammograms for Marker Placement

## 2020-06-02 IMAGING — MG MM BREAST BX W/ LOC DEV 1ST LESION IMAGE BX SPEC STEREO GUIDE*R*
8 of 11 series · 8 of 23 positions shown · non-contrast
Comparison: Previous exams.
COMPARISON: Previous exams.

Addendum:
CLINICAL DATA: Patient presents for stereotactic guided core biopsy
of distortion in the UPPER-OUTER QUADRANT of the RIGHT breast.

EXAM:
RIGHT BREAST STEREOTACTIC CORE NEEDLE BIOPSY

[R (1 of 6)]
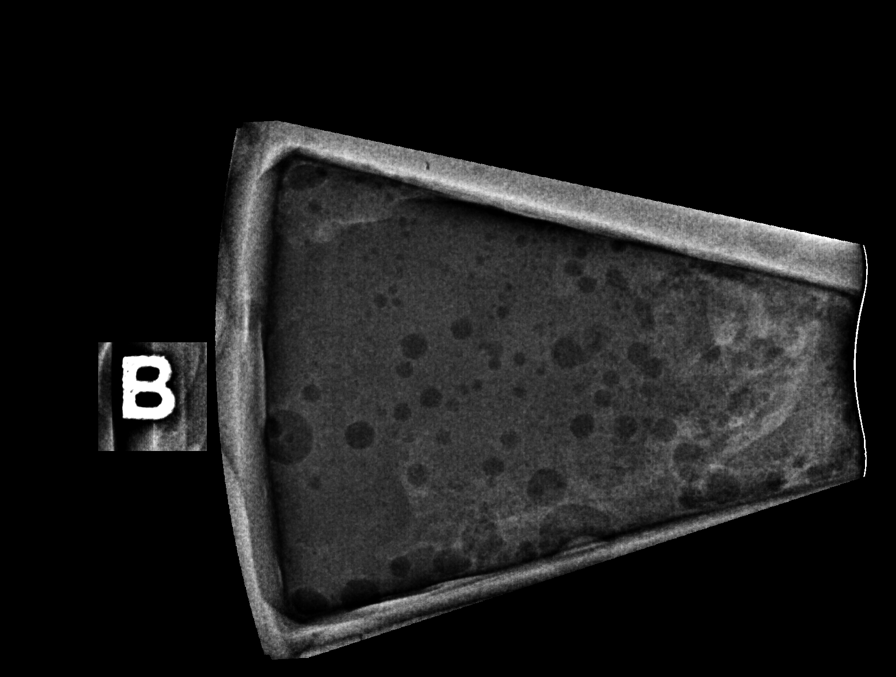

[R (2 of 6)]
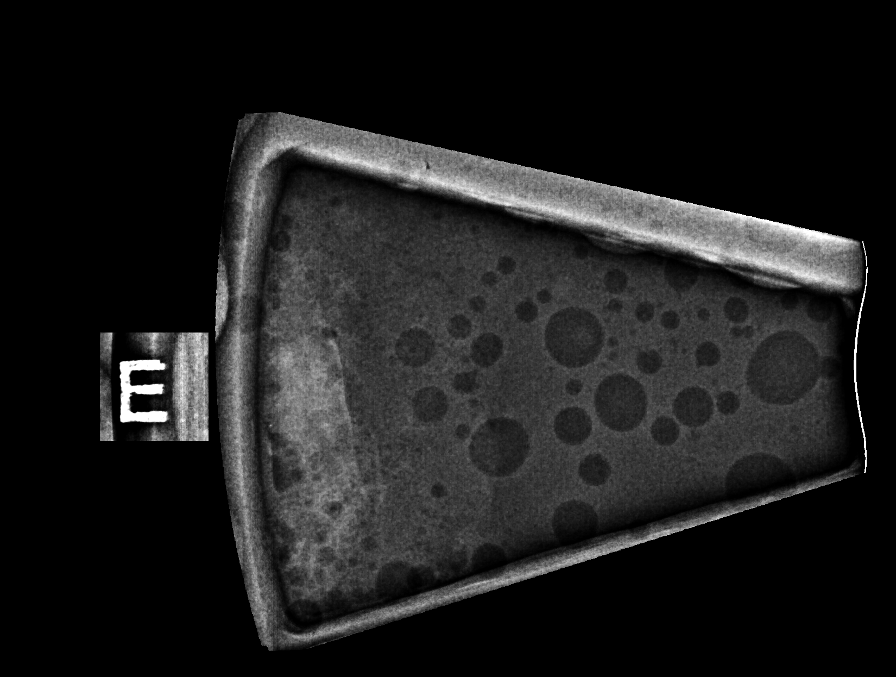

[R (3 of 6)]
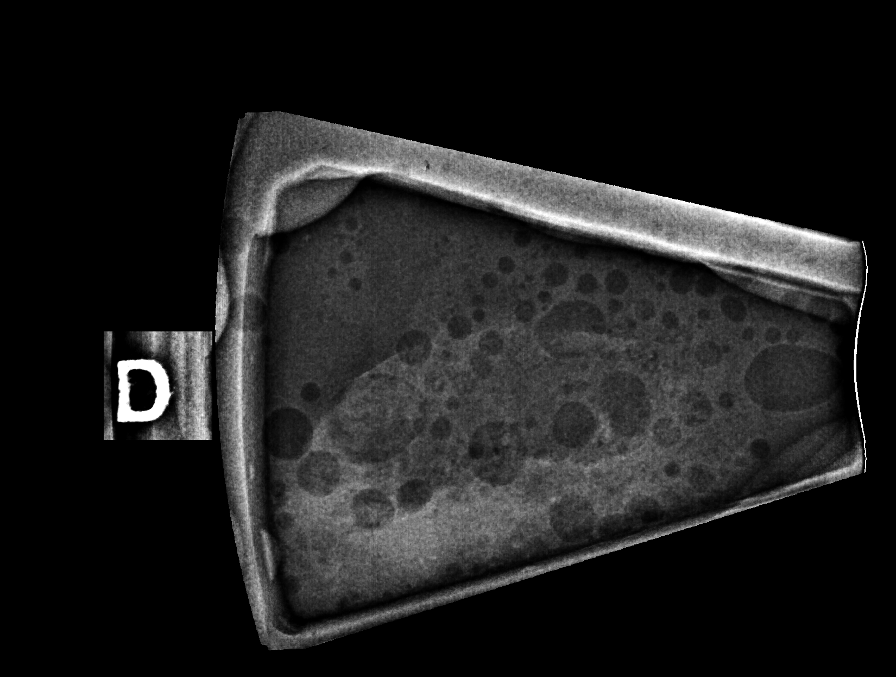

[R (4 of 6)]
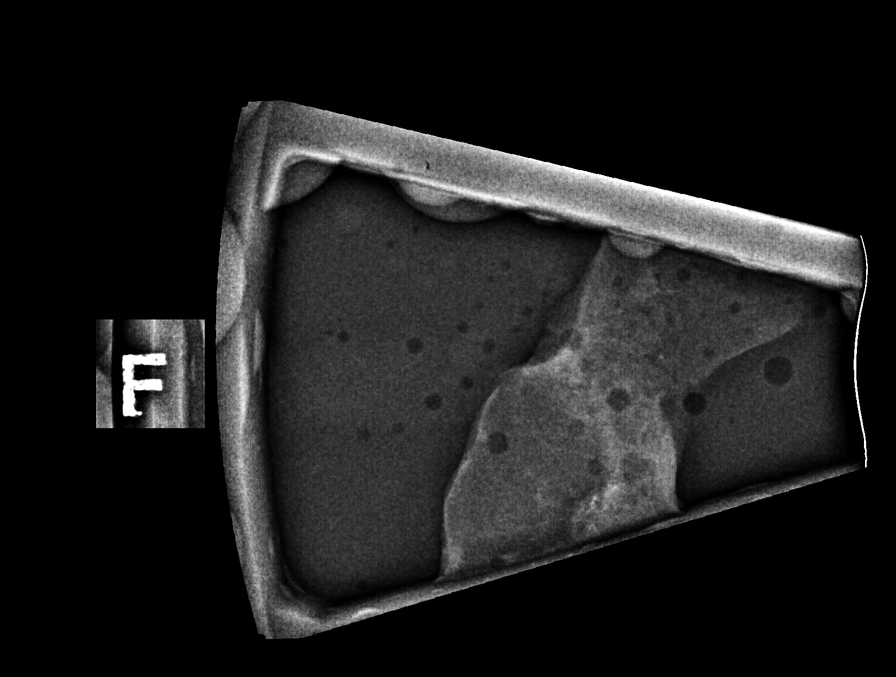

[R (5 of 6)]
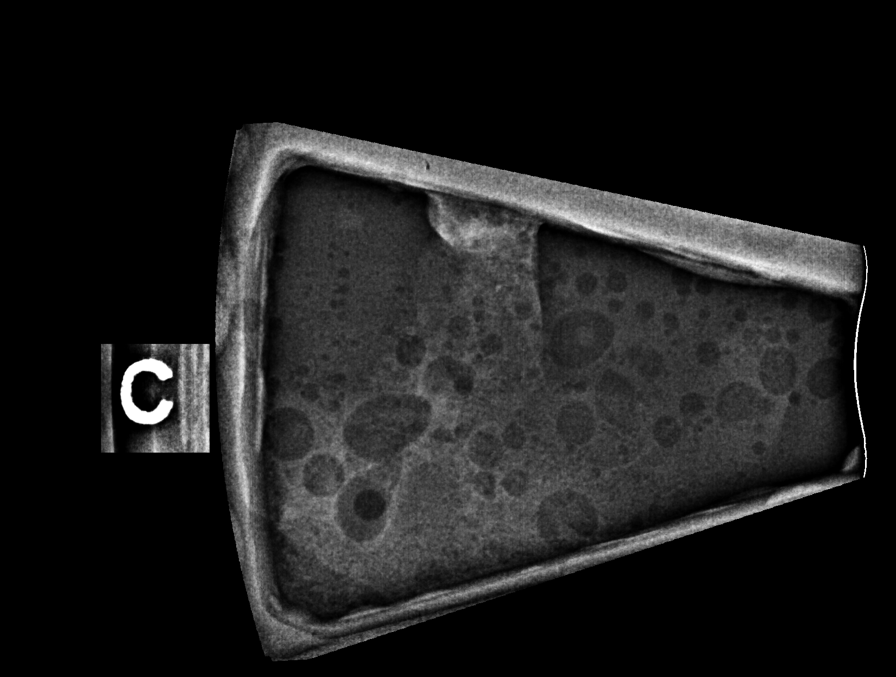

[R (6 of 6)]
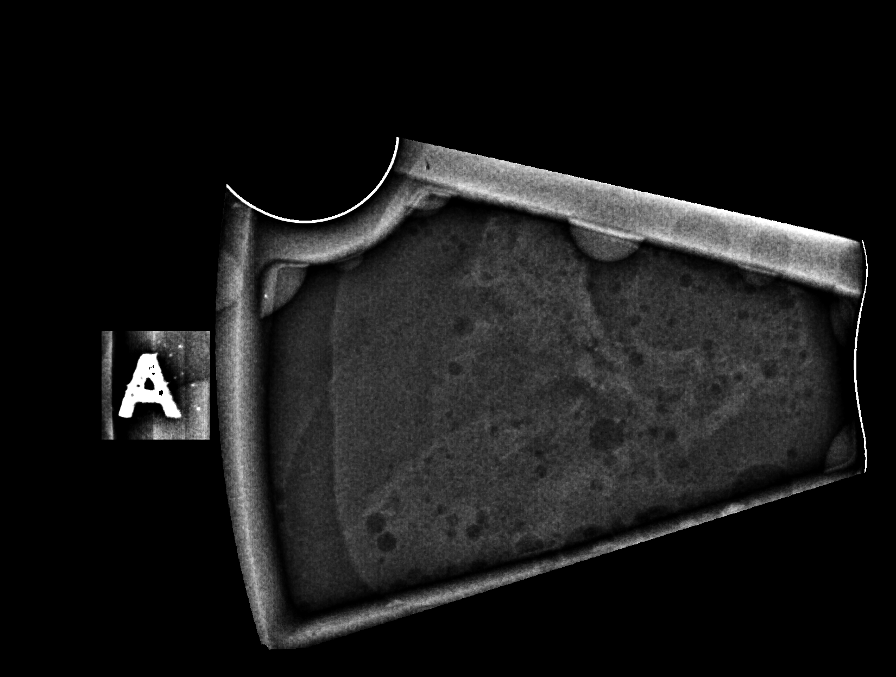

[R CC (1 of 2)]
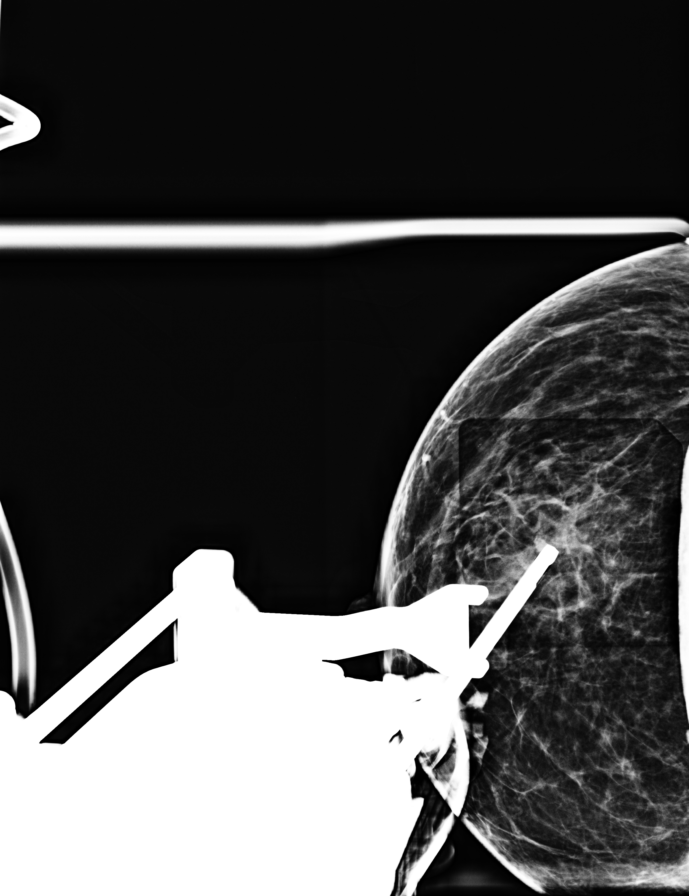

[R CC (2 of 2)]
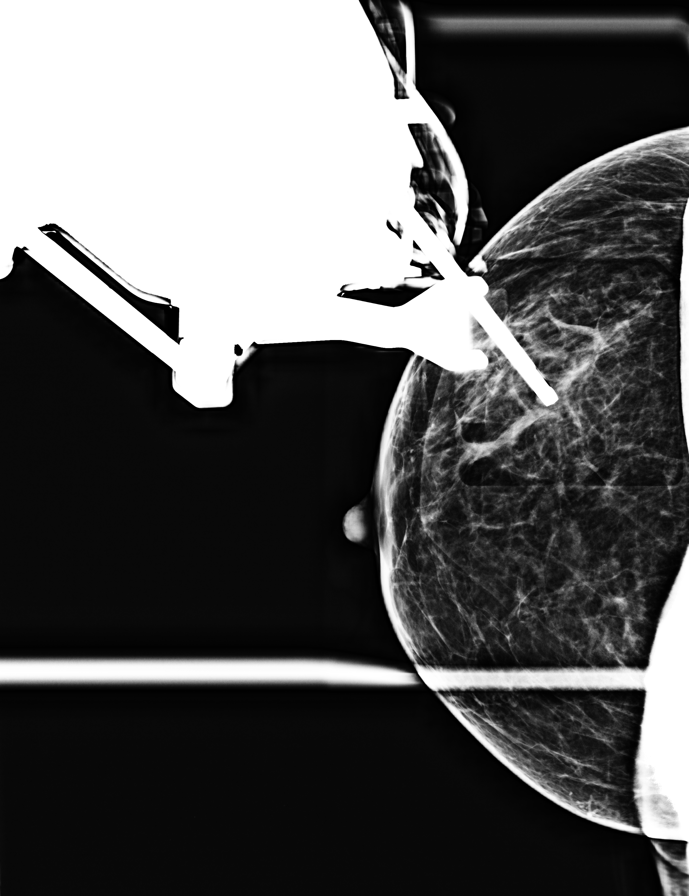

[8 of 23 positions shown; findings below may reference images not displayed]



Using sterile technique and 1% Lidocaine as local anesthetic, under
stereotactic guidance, a 9 gauge vacuum assisted device was used to
perform core needle biopsy of distortion in the UPPER-OUTER QUADRANT
of the RIGHT breast using a craniocaudal approach.

Lesion quadrant: UPPER-OUTER QUADRANT RIGHT breast

At the conclusion of the procedure, a ribbon shaped tissue marker
clip was deployed into the biopsy cavity. Follow-up 2-view mammogram
was performed and dictated separately.
IMPRESSION: Stereotactic-guided biopsy of RIGHT breast distortion. No apparent
complications.

ADDENDUM:
PATHOLOGY revealed: DIAGNOSIS: A. BREAST, RIGHT UPPER OUTER
QUADRANT, [DATE]; STEREOTACTIC BIOPSY: - SMALL COMPLEX SCLEROSING
LESION/RADIAL SCAR. - FIBROCYSTIC CHANGES AND FEW DUCTAL
MICROCALCIFICATIONS. - FOCAL DUCT ECTASIA AND INFLAMMATION. -
NEGATIVE FOR ATYPIA AND MALIGNANCY.

Pathology results are CONCORDANT with imaging findings, per Dr.
Lud Padda.

Pathology results were discussed with patient via telephone. The
patient reported doing well after the biopsy with tenderness at the
site. Post biopsy care instructions were reviewed and questions were
answered. The patient was encouraged to call [HOSPITAL]
for any additional concerns.

Recommendation: Surgical referral. Request for surgical referral was
relayed to Ramon Hernan Simeon RT, at [HOSPITAL], by Mazaira
Marta Heniek RN on 11/18/2018.

Addendum by Animesh Stiff RN on 11/19/2018.



Using sterile technique and 1% Lidocaine as local anesthetic, under
stereotactic guidance, a 9 gauge vacuum assisted device was used to
perform core needle biopsy of distortion in the UPPER-OUTER QUADRANT
of the RIGHT breast using a craniocaudal approach.

Lesion quadrant: UPPER-OUTER QUADRANT RIGHT breast

At the conclusion of the procedure, a ribbon shaped tissue marker
clip was deployed into the biopsy cavity. Follow-up 2-view mammogram
was performed and dictated separately.
IMPRESSION: Stereotactic-guided biopsy of RIGHT breast distortion. No apparent
complications.

## 2020-10-31 ENCOUNTER — Other Ambulatory Visit: Payer: Self-pay | Admitting: Internal Medicine

## 2020-10-31 DIAGNOSIS — Z853 Personal history of malignant neoplasm of breast: Secondary | ICD-10-CM

## 2020-12-04 ENCOUNTER — Ambulatory Visit
Admission: RE | Admit: 2020-12-04 | Discharge: 2020-12-04 | Disposition: A | Discharge status: Home / Self Care | Payer: Medicare Other | Source: Ambulatory Visit | Attending: Internal Medicine | Admitting: Internal Medicine

## 2020-12-04 ENCOUNTER — Other Ambulatory Visit: Payer: Self-pay

## 2020-12-04 DIAGNOSIS — Z853 Personal history of malignant neoplasm of breast: Secondary | ICD-10-CM

## 2021-08-15 ENCOUNTER — Other Ambulatory Visit: Payer: Self-pay | Admitting: *Deleted

## 2021-08-15 DIAGNOSIS — M7989 Other specified soft tissue disorders: Secondary | ICD-10-CM

## 2021-08-20 ENCOUNTER — Encounter (HOSPITAL_COMMUNITY): Payer: Medicare Other

## 2021-08-20 ENCOUNTER — Encounter: Payer: Medicare Other | Admitting: Surgery

## 2021-08-21 NOTE — Progress Notes (Signed)
VASCULAR & VEIN SPECIALISTS           [facility] [place]  History and Physical   Sheri Shaw is a 81 y.o. female who presents today with LLE swelling.  She states that she has some mild swelling throughout the day but improves after she has been in bed at night.  She does not have any hx of DVT.  She does not have any personal or family hx of varicose veins.  She has not worn compression socks.  She states that she is really not bothered by her swelling.  She does have hx of breast cancer that was caught early and only required radiation.    She has hx of GIB 30-35 years ago.  She states that she was taking several aspirins a day.  She has been told that she should not take asa or ibuprofen.    She states that she did have some diverticulitis and had some of her colon removed.   She has not had her colonoscopy in quite some time.   The pt is not on a statin for cholesterol management.  The pt is not on a daily aspirin.   Other AC:  none The pt is not on medication for hypertension.   The pt is not diabetic.   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, 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 (0.4 cm; medial margin   COLON SURGERY  2005   for diverticulitis   RADIOACTIVE SEED GUIDED EXCISIONAL BREAST BIOPSY Right 01/05/2019   Procedure: RADIOACTIVE SEED GUIDED EXCISIONAL RIGHT BREAST  BIOPSY;  Surgeon: Rolm Bookbinder, MD;  Location: [facility];  Service: General;  Laterality: Right;    Social History   Socioeconomic History   Marital status: Married    Spouse name: Not on file   Number of children: Not on file   Years of education: Not on file   Highest education level: Not on file  Occupational History   Not on file  Tobacco Use   Smoking status: Never   Smokeless tobacco: Never  Vaping Use   Vaping Use: Never used  Substance and Sexual Activity   Alcohol use: Never   Drug use: Never   Sexual activity: Not on file  Other Topics Concern   Not on file  Social History Narrative   Not on file   Social Determinants of Health   Financial Resource Strain: Not on file  Food Insecurity: Not on file  Transportation Needs: Not on file  Physical Activity: Not on file  Stress: Not on file  Social Connections: Not on file  Intimate Partner Violence: Not on file     Family History  Problem Relation Age of Onset   Breast cancer Maternal Aunt  dx. in her 55s   Lung cancer Maternal Uncle        dx. in his 65s   COPD Mother    Heart Problems Maternal Grandmother    Pneumonia Maternal Grandfather    Cancer Maternal Aunt        primary peritoneal cancer, dx. in her 51s   Heart attack Maternal Aunt 51   Hodgkin's lymphoma Maternal Uncle 36   Breast cancer Cousin 34       maternal cousin   Esophageal cancer Cousin 108       maternal cousin   Lymphoma Cousin        maternal cousin    Current Outpatient Medications  Medication Sig Dispense Refill   Calcium Citrate-Vitamin D (CALCIUM + D PO) Take by mouth.     diazepam (VALIUM) 5 MG tablet Take 5 mg by mouth at bedtime as needed for anxiety. She takes 2.'5mg'$  nightly     Vitamin D, Cholecalciferol, 50 MCG (2000 UT) CAPS Take by mouth.     No current facility-administered medications for this visit.    No Known Allergies  REVIEW OF SYSTEMS:   '[X]'$  denotes positive finding, '[ ]'$   denotes negative finding Cardiac  Comments:  Chest pain or chest pressure:    Shortness of breath upon exertion:    Short of breath when lying flat:    Irregular heart rhythm:        Vascular    Pain in calf, thigh, or hip brought on by ambulation:    Pain in feet at night that wakes you up from your sleep:     Blood clot in your veins:    Leg swelling:  x Left ankle      Pulmonary    Oxygen at home:    Productive cough:     Wheezing:         Neurologic    Sudden weakness in arms or legs:     Sudden numbness in arms or legs:     Sudden onset of difficulty speaking or slurred speech:    Temporary loss of vision in one eye:     Problems with dizziness:         Gastrointestinal    Blood in stool:     Vomited blood:         Genitourinary    Burning when urinating:     Blood in urine:        Psychiatric    Major depression:         Hematologic    Bleeding problems:    Problems with blood clotting too easily:        Skin    Rashes or ulcers:        Constitutional    Fever or chills:      PHYSICAL EXAMINATION:  Today's Vitals   08/29/21 0924  BP: (!) 155/63  Pulse: (!) 59  Resp: 20  Temp: 97.8 F (36.6 C)  TempSrc: Temporal  SpO2: 99%  Weight: 113 lb 3.2 oz (51.3 kg)  Height: '5\' 7"'$  (1.702 m)  PainSc: 0-No pain   Body mass index is 17.73 kg/m.   General:  WDWN in NAD; vital signs documented above Gait: Not observed HENT: WNL, normocephalic Pulmonary: normal non-labored breathing without wheezing Cardiac: regular HR; without carotid bruits Abdomen: soft, NT, no masses; aortic pulse is not palpable Skin: without rashes Vascular Exam/Pulses:  Right Left  Radial 2+ (normal) 2+ (normal)  PT 2+ (normal)  2+ (normal)   Extremities: mild left ankle edema; there are superficial veins present  Neurologic: A&O X 3;  moving all extremities equally Psychiatric:  The pt has Normal affect.   Non-Invasive Vascular Imaging:   Venous duplex on  08/27/2021 +--------------+---------+------+-----------+------------+-----------------   LEFT          Reflux NoRefluxReflux TimeDiameter cmsComments                                  Yes                                           +--------------+---------+------+-----------+------------+-----------------  CFV           no                                                      +--------------+---------+------+-----------+------------+-----------------  FV mid        no                                                      +--------------+---------+------+-----------+------------+-----------------  Popliteal     no                                                          +--------------+---------+------+-----------+------------+-----------------  GSV at [facility]    no                            0.44                      +--------------+---------+------+-----------+------------+-----------------  GSV prox thighno                            0.17                      +--------------+---------+------+-----------+------------+-----------------  GSV mid thigh no                            0.24                      +--------------+---------+------+-----------+------------+-----------------  GSV dist thighno                            0.12                      +--------------+---------+------+-----------+------------+-----------------  GSV at knee   no                            0.22                      +--------------+---------+------+-----------+------------+-----------------  GSV prox calf no                            0.17                      +--------------+---------+------+-----------+------------+-----------------  SSV Pop Fossa           yes    >500 ms      0.43     small amounts of residual, Chronic thrombus Throughout vein              +--------------+---------+------+-----------+------------+-----------------  SSV prox calf            yes    >500 ms      0.32     small amounts of residual, chronic thrombus throughout vein                 +--------------+---------+------+-----------+------------+-----------------  SSV mid calf            yes    >500 ms      0.34     small amounts of residual, chronic thrombus throughout vein              +--------------+---------+------+-----------+------------+-----------------    Summary:  Left:  - No evidence of deep vein thrombosis seen in the left lower extremity, from the common femoral through the popliteal veins.  - Color duplex evaluation of the left lower extremity shows there are very small amounts of residual, chronic thrombus in the lesser saphenous vein.     - Venous reflux is noted in the left short saphenous vein.     CHANIKA BYLAND is a 81 y.o. female who presents with: Left ankle swelling/chronic venous insufficiency  -pt has easily palpable PT pedal pulses -pt does not have evidence of DVT.  Pt does have venous reflux in the SSV and some small amount of chronic residual thrombus in the SSV.  Given this is not a DVT, she does not need anticoagulation, but did discuss that if she could tolerate a daily baby aspirin or every other day baby aspirin, would recommend this.  She will discuss with Dr. Caryl Comes.  Also discussed keeping up to date on cancer screenings including colonoscopy. -discussed with pt about wearing knee high 15-20 mmHg compression stockings.  She does not feel she needs these at this time but if her swelling worsens, she will come back to get these.   -discussed the importance of leg elevation and how to elevate properly - pt is advised to elevate their legs and a diagram is given to them to demonstrate for pt to lay flat on their back with knees elevated and slightly bent with their feet higher than their knees, which puts their feet higher than their heart for 15 minutes per day.  If pt cannot lay flat, advised to lay as flat as possible.   -pt is advised to continue as much walking as possible and avoid sitting or standing for long periods of time.  -discussed with pt that exercise and that water aerobics would also be beneficial.  -handout with recommendations given -pt will f/u as needed   Leontine Locket, [facility] 08/21/2021 3:37 PM  Clinic MD:  Donzetta Matters

## 2021-08-27 ENCOUNTER — Ambulatory Visit (HOSPITAL_COMMUNITY)
Admission: RE | Admit: 2021-08-27 | Discharge: 2021-08-27 | Disposition: A | Discharge status: Home / Self Care | Payer: Medicare Other | Source: Ambulatory Visit | Attending: Internal Medicine | Admitting: Internal Medicine

## 2021-08-27 DIAGNOSIS — M7989 Other specified soft tissue disorders: Secondary | ICD-10-CM | POA: Insufficient documentation

## 2021-08-29 ENCOUNTER — Ambulatory Visit (INDEPENDENT_AMBULATORY_CARE_PROVIDER_SITE_OTHER): Payer: Medicare Other | Admitting: Physician Assistant

## 2021-08-29 ENCOUNTER — Encounter: Payer: Self-pay | Admitting: Physician Assistant

## 2021-08-29 VITALS — BP 155/63 | HR 59 | Temp 97.8°F | Resp 20 | Ht 67.0 in | Wt 113.2 lb

## 2021-08-29 DIAGNOSIS — M25472 Effusion, left ankle: Secondary | ICD-10-CM

## 2021-08-29 DIAGNOSIS — I872 Venous insufficiency (chronic) (peripheral): Secondary | ICD-10-CM | POA: Diagnosis not present

## 2021-11-29 ENCOUNTER — Other Ambulatory Visit: Payer: Self-pay | Admitting: Internal Medicine

## 2021-11-29 DIAGNOSIS — Z1231 Encounter for screening mammogram for malignant neoplasm of breast: Secondary | ICD-10-CM

## 2021-12-28 ENCOUNTER — Ambulatory Visit
Admission: RE | Admit: 2021-12-28 | Discharge: 2021-12-28 | Disposition: A | Discharge status: Home / Self Care | Payer: Medicare Other | Source: Ambulatory Visit | Attending: Internal Medicine | Admitting: Internal Medicine

## 2021-12-28 DIAGNOSIS — Z1231 Encounter for screening mammogram for malignant neoplasm of breast: Secondary | ICD-10-CM | POA: Insufficient documentation

## 2022-01-21 ENCOUNTER — Ambulatory Visit (LOCAL_COMMUNITY_HEALTH_CENTER): Payer: Medicare Other

## 2022-01-21 DIAGNOSIS — Z23 Encounter for immunization: Secondary | ICD-10-CM | POA: Diagnosis not present

## 2022-01-21 DIAGNOSIS — Z7185 Encounter for immunization safety counseling: Secondary | ICD-10-CM

## 2022-01-21 NOTE — Progress Notes (Signed)
  Are you feeling sick today? No   Have you ever received a dose of COVID-19 Vaccine? [employer], [place], [place], [place], Other) Yes  If yes, which vaccine and how many doses?   Pfizer and 4 doses   Did you bring the vaccination record card or other documentation?  No   Do you have a health condition or are undergoing treatment that makes you moderately or severely immunocompromised? This would include, but not be limited to: cancer, HIV, organ transplant, immunosuppressive therapy/high-dose corticosteroids, or moderate/severe primary immunodeficiency.  No  Have you received COVID-19 vaccine before or during hematopoietic cell transplant (HCT) or CAR-T-cell therapies? No  Have you ever had an allergic reaction to: (This would include a severe allergic reaction or a reaction that caused hives, swelling, or respiratory distress, including wheezing.) A component of a COVID-19 vaccine or a previous dose of COVID-19 vaccine? No   Have you ever had an allergic reaction to another vaccine (other thanCOVID-19 vaccine) or an injectable medication? (This would include a severe allergic reaction or a reaction that caused hives, swelling, or respiratory distress, including wheezing.)   No    Do you have a history of any of the following:  Myocarditis or Pericarditis No  Dermal fillers:  No  Multisystem Inflammatory Syndrome (MIS-C or MIS-A)? No  COVID-19 disease within the past 3 months? No  Vaccinated with monkeypox vaccine in the last 4 weeks? No  Tolerated Covid Pfizer Comirnaty [phone] (12 yrs +) well today. Stayed for 15 min observation without problem. Updated [facility] copy given. Josie Saunders, RN

## 2023-01-07 ENCOUNTER — Other Ambulatory Visit: Payer: Self-pay | Admitting: Internal Medicine

## 2023-01-07 DIAGNOSIS — Z1231 Encounter for screening mammogram for malignant neoplasm of breast: Secondary | ICD-10-CM

## 2023-01-09 ENCOUNTER — Ambulatory Visit
Admission: RE | Admit: 2023-01-09 | Discharge: 2023-01-09 | Disposition: A | Discharge status: Home / Self Care | Payer: Medicare Other | Source: Ambulatory Visit | Attending: Internal Medicine | Admitting: Internal Medicine

## 2023-01-09 DIAGNOSIS — Z1231 Encounter for screening mammogram for malignant neoplasm of breast: Secondary | ICD-10-CM | POA: Diagnosis present

## 2023-02-24 ENCOUNTER — Ambulatory Visit: Payer: Medicare Other | Admitting: Podiatry

## 2023-03-12 ENCOUNTER — Ambulatory Visit: Payer: Medicare Other | Admitting: Podiatry

## 2023-12-24 ENCOUNTER — Other Ambulatory Visit: Payer: Self-pay | Admitting: Internal Medicine

## 2023-12-24 DIAGNOSIS — R1084 Generalized abdominal pain: Secondary | ICD-10-CM

## 2023-12-25 ENCOUNTER — Ambulatory Visit
Admission: RE | Admit: 2023-12-25 | Discharge: 2023-12-25 | Disposition: A | Discharge status: Home / Self Care | Source: Ambulatory Visit | Attending: Internal Medicine | Admitting: Internal Medicine

## 2023-12-25 DIAGNOSIS — R1084 Generalized abdominal pain: Secondary | ICD-10-CM | POA: Insufficient documentation

## 2023-12-25 MED ORDER — IOHEXOL 300 MG/ML  SOLN
100.0000 mL | Freq: Once | INTRAMUSCULAR | Status: AC | PRN
  Administered 2023-12-25: 100 mL via INTRAVENOUS

## 2023-12-30 ENCOUNTER — Other Ambulatory Visit: Payer: Self-pay | Admitting: *Deleted

## 2023-12-30 ENCOUNTER — Inpatient Hospital Stay: Attending: Oncology | Admitting: Oncology

## 2023-12-30 ENCOUNTER — Inpatient Hospital Stay (HOSPITAL_BASED_OUTPATIENT_CLINIC_OR_DEPARTMENT_OTHER): Admitting: Hospice and Palliative Medicine

## 2023-12-30 ENCOUNTER — Encounter: Payer: Self-pay | Admitting: Oncology

## 2023-12-30 ENCOUNTER — Inpatient Hospital Stay

## 2023-12-30 VITALS — BP 123/60 | HR 74 | Temp 98.8°F | Resp 18 | Wt 104.9 lb

## 2023-12-30 DIAGNOSIS — R630 Anorexia: Secondary | ICD-10-CM | POA: Diagnosis not present

## 2023-12-30 DIAGNOSIS — K8689 Other specified diseases of pancreas: Secondary | ICD-10-CM

## 2023-12-30 DIAGNOSIS — R935 Abnormal findings on diagnostic imaging of other abdominal regions, including retroperitoneum: Secondary | ICD-10-CM

## 2023-12-30 DIAGNOSIS — C787 Secondary malignant neoplasm of liver and intrahepatic bile duct: Secondary | ICD-10-CM | POA: Insufficient documentation

## 2023-12-30 DIAGNOSIS — C259 Malignant neoplasm of pancreas, unspecified: Secondary | ICD-10-CM | POA: Insufficient documentation

## 2023-12-30 DIAGNOSIS — R110 Nausea: Secondary | ICD-10-CM | POA: Insufficient documentation

## 2023-12-30 DIAGNOSIS — Z800 Family history of malignant neoplasm of digestive organs: Secondary | ICD-10-CM | POA: Insufficient documentation

## 2023-12-30 DIAGNOSIS — Z853 Personal history of malignant neoplasm of breast: Secondary | ICD-10-CM | POA: Diagnosis not present

## 2023-12-30 DIAGNOSIS — Z807 Family history of other malignant neoplasms of lymphoid, hematopoietic and related tissues: Secondary | ICD-10-CM | POA: Insufficient documentation

## 2023-12-30 DIAGNOSIS — K769 Liver disease, unspecified: Secondary | ICD-10-CM | POA: Diagnosis not present

## 2023-12-30 DIAGNOSIS — Z803 Family history of malignant neoplasm of breast: Secondary | ICD-10-CM | POA: Diagnosis not present

## 2023-12-30 DIAGNOSIS — Z515 Encounter for palliative care: Secondary | ICD-10-CM

## 2023-12-30 DIAGNOSIS — D72829 Elevated white blood cell count, unspecified: Secondary | ICD-10-CM | POA: Insufficient documentation

## 2023-12-30 DIAGNOSIS — R978 Other abnormal tumor markers: Secondary | ICD-10-CM

## 2023-12-30 MED ORDER — DEXAMETHASONE 4 MG PO TABS: 4.0000 mg | ORAL_TABLET | Freq: Every day | ORAL | 0 refills | Status: DC

## 2023-12-30 MED ORDER — TRAMADOL HCL 50 MG PO TABS: 50.0000 mg | ORAL_TABLET | Freq: Three times a day (TID) | ORAL | 0 refills | Status: DC | PRN

## 2023-12-30 NOTE — Progress Notes (Signed)
 Palliative Medicine [facility] at [facility] Telephone:([phone] [phone]   Name: Sheri Shaw Date: 12/30/2023 MRN: [id number]  DOB: Nov 24, 1940  Patient Care Team: Fernande Ophelia JINNY DOUGLAS, MD as PCP - General (Internal Medicine) Tyree Nanetta SAILOR, RN as Oncology Nurse Navigator Izell Domino, MD as Consulting Physician (Radiation Oncology) Ebbie Cough, MD as Consulting Physician (General Surgery)    REASON FOR CONSULTATION: JAYNA MULNIX is a 83 y.o. female who underwent workup for persistent abdominal pain.  CT revealed an ill-defined mass in the pancreatic body and tail with encasement of the celiac artery and abutment of the superior mesenteric artery and complete effacement of the superior mesenteric vein as well as multifocal lesions in the liver suspicious for metastatic disease.  Patient was referred to palliative care to address goals and manage ongoing symptoms.  SOCIAL HISTORY:     reports that she has never smoked. She has never been exposed to tobacco smoke. She has never used smokeless tobacco. She reports that she does not drink alcohol and does not use drugs.  Patient is widowed.  Lives at home alone.  Has a son who lives nearby and a daughter in [place].  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 complex 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,  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 (0.4 cm; medial margin   COLON SURGERY  2005   for diverticulitis   RADIOACTIVE SEED GUIDED EXCISIONAL BREAST BIOPSY Right 01/05/2019   Procedure: RADIOACTIVE SEED GUIDED EXCISIONAL RIGHT BREAST BIOPSY;  Surgeon: Ebbie Cough, MD;  Location: [facility];  Service: General;  Laterality: Right;    HEMATOLOGY/ONCOLOGY HISTORY:  Oncology History  Carcinoma of upper-outer quadrant of right breast in female, estrogen receptor positive (HCC)  02/09/2019 Cancer Staging   Staging form: Breast, AJCC 8th Edition - Pathologic stage from 02/09/2019: Stage Unknown (pT1a, pNX, cM0, G1, ER+, PR+, HER2-) - Signed by Izell Domino, MD on 02/10/2019   02/10/2019 Initial Diagnosis   Carcinoma of upper-outer quadrant of right breast in female, estrogen receptor positive (HCC)   09/03/2019 Genetic Testing   Negative genetic testing:  No pathogenic variants detected on the [facility] Breast and Gyn Cancers Panel. The report date is 09/03/2019.  The Breast and Gyn Cancers Panel offered by [facility] includes sequencing and deletion/duplication analysis of the following 23 genes:  ATM, BARD1, BRCA1, BRCA2, BRIP1, CDH1, CHEK2, DICER1, EPCAM, MLH1, MSH2, MSH6, NBN, NF1, PALB2, PMS2, PTEN, RAD50, RAD51C, RAD51D, SMARCA4, STK11, and TP53.     ALLERGIES:  has no known allergies.  MEDICATIONS:  Current Outpatient Medications  Medication Sig Dispense Refill   diazepam (VALIUM) 5 MG tablet Take 5 mg by mouth at bedtime as needed for anxiety. She takes 2.5mg  nightly     levothyroxine (SYNTHROID) 25 MCG tablet Take 25 mcg by mouth daily.  prochlorperazine (COMPAZINE) 10 MG tablet Take 10 mg by mouth. (Patient not taking: Reported on 12/30/2023)     Vitamin D, Cholecalciferol, 50 MCG (2000 UT) CAPS Take by mouth.     No current facility-administered medications for this visit.    VITAL SIGNS: There were no vitals taken for this visit. There were no vitals  filed for this visit.  Estimated body mass index is 16.43 kg/m as calculated from the following:   Height as of 08/29/21: 5' 7 (1.702 m).   Weight as of an earlier encounter on 12/30/23: 104 lb 14.4 oz (47.6 kg).  LABS: CBC:    Component Value Date/Time   WBC 6.4 04/13/2020 1306   HGB 12.0 04/13/2020 1306   HGB 13.1 03/09/2019 1516   HCT 36.9 04/13/2020 1306   PLT 214 04/13/2020 1306   PLT 233 03/09/2019 1516   MCV 95.6 04/13/2020 1306   NEUTROABS 3.5 04/13/2020 1306   LYMPHSABS 2.0 04/13/2020 1306   MONOABS 0.7 04/13/2020 1306   EOSABS 0.1 04/13/2020 1306   BASOSABS 0.1 04/13/2020 1306   Comprehensive Metabolic Panel:    Component Value Date/Time   NA 132 (L) 04/13/2020 1306   K 4.5 04/13/2020 1306   CL 100 04/13/2020 1306   CO2 27 04/13/2020 1306   BUN 14 04/13/2020 1306   CREATININE 0.77 04/13/2020 1306   CREATININE 0.95 03/09/2019 1516   GLUCOSE 83 04/13/2020 1306   CALCIUM 9.8 04/13/2020 1306   AST 15 04/13/2020 1306   AST 16 03/09/2019 1516   ALT 9 04/13/2020 1306   ALT 11 03/09/2019 1516   ALKPHOS 45 04/13/2020 1306   BILITOT 0.3 04/13/2020 1306   BILITOT <0.2 (L) 03/09/2019 1516   PROT 6.7 04/13/2020 1306   ALBUMIN 3.8 04/13/2020 1306    RADIOGRAPHIC STUDIES: CT ABDOMEN PELVIS W WO CONTRAST Result Date: 12/25/2023 CLINICAL DATA:  Generalized abdominal pain and nausea. * Tracking Code: BO * EXAM: CT ABDOMEN AND PELVIS WITHOUT AND WITH CONTRAST TECHNIQUE: Multidetector CT imaging of the abdomen and pelvis was performed following the standard protocol before and following the bolus administration of intravenous contrast. RADIATION DOSE REDUCTION: This exam was performed according to the departmental dose-optimization program which includes automated exposure control, adjustment of the mA and/or kV according to patient size and/or use of iterative reconstruction technique. CONTRAST:  100mL OMNIPAQUE IOHEXOL 300 MG/ML  SOLN COMPARISON:  None Available. FINDINGS:  Lower chest: Subsegmental lingular and right middle lobe atelectasis. Irregular 7 x 6 mm and 6 x 5 mm left lower lobe nodules (4:4). No pleural effusion or pneumothorax demonstrated. Partially imaged heart size is normal. Hepatobiliary: Multifocal peripherally enhancing lesions throughout the liver, for example 4.6 x 4.2 cm segment 7/8 (7:12), 2.7 x 2.0 cm segment 2/3 (7:18), and 3.7 x 3.5 cm segment 5/6 (7:27). No intra or extrahepatic biliary ductal dilation. Normal gallbladder. Pancreas: Ill-defined, expansile mass within the truncated pancreatic body/tail measures 3.6 x 2.5 cm (7:16). There is encasement of the proximal celiac artery and abutment along the anterior aspect of the superior mesenteric artery (7:12). Complete effacement of the superior mesenteric vein at the junction with portal vein. Spleen: Normal in size without focal abnormality. Adrenals/Urinary Tract: No adrenal nodules. No hydronephrosis or calculi. Bilateral subcentimeter hypodensities, too small to characterize but likely cysts. No focal bladder wall thickening. Stomach/Bowel: Normal appearance of the stomach. No evidence of bowel wall thickening, distention, or inflammatory changes. Moderate volume stool throughout the colon. Colonic diverticulosis without acute diverticulitis. Appendix is not  discretely seen. Vascular/Lymphatic: Aortic atherosclerosis. 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 No enlarged abdominal or pelvic lymph nodes. Reproductive: No adnexal masses. Other: No free fluid, fluid collection, or free air. Musculoskeletal: No acute or abnormal lytic or blastic osseous lesions. IMPRESSION: 1. Ill-defined pancreatic mass, suspicious for adenocarcinoma. Vascular involvement as described. 2. Multifocal peripherally enhancing lesions throughout the liver, suspicious for metastatic disease. 3. Irregular 7 x 6 mm and 6 x 5 mm left lower lobe nodules, suspicious for metastatic disease. 4.  Aortic Atherosclerosis (ICD10-I70.0). These  results will be called to the ordering clinician or representative by the Radiologist Assistant, and communication documented in the PACS or [employer]. Electronically Signed   By: Limin  Xu M.D.   On: 12/25/2023 14:37    PERFORMANCE STATUS (ECOG) : 1 - Symptomatic but completely ambulatory  Review of Systems Unless otherwise noted, a complete review of systems is negative.  Physical Exam General: NAD Pulmonary: Unlabored Extremities: no edema, no joint deformities Skin: no rashes Neurological: nonfocal  IMPRESSION: CT of the abdomen and pelvis showing ill-defined pancreatic mass with vascular involvement and multifocal lesions throughout the liver suspicious for stage IV pancreatic cancer.  Met with patient and daughter.  Patient's son participated in the visit via phone.  Patient met earlier with Dr. Jacobo and has agreed to pursue biopsy.  However, she states rather emphatically that she does not think she is interested in pursuing any cancer treatment such as chemotherapy.  She says either way this condition is terminal and she does not want to risk negatively impacting her quality of life to pursue treatment.  Patient and family are going to discuss this in more detail and let us  know how they wish to proceed.  I did discuss the possible option of hospice involvement if patient opts to forego treatment.  Symptomatically, she endorses upper abdominal pain, which radiates to her back.  She is currently taking Tylenol  with limited benefit.  Dr. Jacobo has prescribed tramadol.  Patient also has nausea unrelieved with ondansetron  and promethazine.  Appetite is poor.  She has been started on dexamethasone .  If this is ineffective, would recommend trial of olanzapine.  ACP documents are on file.  Patient's daughter is her healthcare power of attorney.  Patient says that she has a signed DNR order at home.  She says she would not want to be resuscitated or have her life prolonged  artificially on machines.  PLAN: - Plan for biopsy - Patient speaking with family to discuss treatment goals - Agree with tramadol as needed for pain - Daily bowel regimen - Trial of dexamethasone  - DNR/DNI  Case and plan discussed with Dr. Jacobo  Patient expressed understanding and was in agreement with this plan. She also understands that She can call the clinic at any time with any questions, concerns, or complaints.     Time Total: 20 minutes  Visit consisted of counseling and education dealing with the complex and emotionally intense issues of symptom management and palliative care in the setting of serious and potentially life-threatening illness.Greater than 50%  of this time was spent counseling and coordinating care related to the above assessment and plan.  Signed by: Fonda Mower, PhD, NP-C

## 2023-12-30 NOTE — Progress Notes (Signed)
 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  INTERVAL HISTORY: Patient is an 83 year old female who recently presented to her primary care physician with a decreased appetite and abdominal pain.  Subsequent evaluation included CT scan which revealed a pancreatic mass and liver lesions highly suspicious for malignancy.  Patient has increasing weakness and fatigue, but otherwise feels well.  She has no neurologic complaints.  She denies any recent fevers or illnesses.  She has no chest pain, shortness of breath, cough, or hemoptysis.  She denies any vomiting, constipation, or diarrhea.  She admits to increased nausea.  She has no urinary complaints.  Patient offers no further specific complaints today.  REVIEW OF SYSTEMS:   Review of Systems  Constitutional:  Positive for malaise/fatigue. Negative for fever and weight loss.  Respiratory: Negative.  Negative for cough, hemoptysis and shortness of breath.   Cardiovascular: Negative.  Negative for chest pain and leg swelling.  Gastrointestinal:  Positive for abdominal pain and nausea. Negative for blood in stool and melena.  Genitourinary: Negative.  Negative for dysuria.  Musculoskeletal: Negative.  Negative for back pain.  Skin: Negative.  Negative for rash.  Neurological:  Positive for weakness. Negative for dizziness and speech change.  Psychiatric/Behavioral: Negative.  The patient is not nervous/anxious.     As per HPI. Otherwise, a  complete review of systems is negative.  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 in her 20s   Lung cancer Maternal Uncle        dx. in his 55s   COPD Mother    Heart Problems Maternal Grandmother    Pneumonia Maternal Grandfather    Cancer Maternal Aunt        primary peritoneal cancer, dx. in her 12s   Heart attack Maternal Aunt 51   Hodgkin's lymphoma Maternal Uncle 36   Breast cancer Cousin 72       maternal cousin   Esophageal cancer Cousin 29  maternal cousin   Lymphoma Cousin        maternal cousin    ADVANCED DIRECTIVES (Y/N):  N  HEALTH MAINTENANCE: Social History   Tobacco Use   Smoking status: Never    Passive exposure: Never   Smokeless tobacco: Never  Vaping Use   Vaping status: Never Used  Substance Use Topics   Alcohol use: Never   Drug use: Never     Colonoscopy:  PAP:  Bone density:  Lipid panel:  No Known  Allergies  Current Outpatient Medications  Medication Sig Dispense Refill   dexamethasone  (DECADRON ) 4 MG tablet Take 1 tablet (4 mg total) by mouth daily. 30 tablet 0   diazepam (VALIUM) 5 MG tablet Take 5 mg by mouth at bedtime as needed for anxiety. She takes 2.5mg  nightly     levothyroxine (SYNTHROID) 25 MCG tablet Take 25 mcg by mouth daily.     Vitamin D, Cholecalciferol, 50 MCG (2000 UT) CAPS Take by mouth.     prochlorperazine (COMPAZINE) 10 MG tablet Take 10 mg by mouth. (Patient not taking: Reported on 12/30/2023)     No current facility-administered medications for this visit.    OBJECTIVE: Vitals:   12/30/23 0908  BP: 123/60  Pulse: 74  Resp: 18  Temp: 98.8 F (37.1 C)  SpO2: 100%     Body mass index is 16.43 kg/m.    ECOG FS:1 - Symptomatic but completely ambulatory  General: Well-developed, well-nourished, no acute distress. Eyes: Pink conjunctiva, anicteric sclera. HEENT: Normocephalic, moist mucous membranes. Lungs: No audible wheezing or coughing. Heart: Regular rate and rhythm. Abdomen: Soft, nontender, no obvious distention. Musculoskeletal: No edema, cyanosis, or clubbing. Neuro: Alert, answering all questions appropriately. Cranial nerves grossly intact. Skin: No rashes or petechiae noted. Psych: Normal affect. Lymphatics: No cervical, calvicular, axillary or inguinal LAD.   LAB RESULTS:  Lab Results  Component Value Date   NA 132 (L) 04/13/2020   K 4.5 04/13/2020   CL 100 04/13/2020   CO2 27 04/13/2020   GLUCOSE 83 04/13/2020   BUN 14 04/13/2020   CREATININE 0.77 04/13/2020   CALCIUM 9.8 04/13/2020   PROT 6.7 04/13/2020   ALBUMIN 3.8 04/13/2020   AST 15 04/13/2020   ALT 9 04/13/2020   ALKPHOS 45 04/13/2020   BILITOT 0.3 04/13/2020   GFRNONAA >60 04/13/2020   GFRAA >60 03/09/2019    Lab Results  Component Value Date   WBC 6.4 04/13/2020   NEUTROABS 3.5 04/13/2020   HGB 12.0 04/13/2020   HCT 36.9 04/13/2020   MCV 95.6 04/13/2020    PLT 214 04/13/2020     STUDIES: CT ABDOMEN PELVIS W WO CONTRAST Result Date: 12/25/2023 CLINICAL DATA:  Generalized abdominal pain and nausea. * Tracking Code: BO * EXAM: CT ABDOMEN AND PELVIS WITHOUT AND WITH CONTRAST TECHNIQUE: Multidetector CT imaging of the abdomen and pelvis was performed following the standard protocol before and following the bolus administration of intravenous contrast. RADIATION DOSE REDUCTION: This exam was performed according to the departmental dose-optimization program which includes automated exposure control, adjustment of the mA and/or kV according to patient size and/or use of iterative reconstruction technique. CONTRAST:  100mL OMNIPAQUE IOHEXOL 300 MG/ML  SOLN COMPARISON:  None Available. FINDINGS: Lower chest: Subsegmental lingular and right middle lobe atelectasis. Irregular 7 x 6 mm and 6 x 5 mm left lower lobe nodules (4:4). No pleural effusion or pneumothorax demonstrated. Partially imaged heart size is normal. Hepatobiliary: Multifocal peripherally enhancing lesions throughout the liver, for example  4.6 x 4.2 cm segment 7/8 (7:12), 2.7 x 2.0 cm segment 2/3 (7:18), and 3.7 x 3.5 cm segment 5/6 (7:27). No intra or extrahepatic biliary ductal dilation. Normal gallbladder. Pancreas: Ill-defined, expansile mass within the truncated pancreatic body/tail measures 3.6 x 2.5 cm (7:16). There is encasement of the proximal celiac artery and abutment along the anterior aspect of the superior mesenteric artery (7:12). Complete effacement of the superior mesenteric vein at the junction with portal vein. Spleen: Normal in size without focal abnormality. Adrenals/Urinary Tract: No adrenal nodules. No hydronephrosis or calculi. Bilateral subcentimeter hypodensities, too small to characterize but likely cysts. No focal bladder wall thickening. Stomach/Bowel: Normal appearance of the stomach. No evidence of bowel wall thickening, distention, or inflammatory changes. Moderate volume  stool throughout the colon. Colonic diverticulosis without acute diverticulitis. Appendix is not discretely seen. Vascular/Lymphatic: Aortic atherosclerosis. 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 No enlarged abdominal or pelvic lymph nodes. Reproductive: No adnexal masses. Other: No free fluid, fluid collection, or free air. Musculoskeletal: No acute or abnormal lytic or blastic osseous lesions. IMPRESSION: 1. Ill-defined pancreatic mass, suspicious for adenocarcinoma. Vascular involvement as described. 2. Multifocal peripherally enhancing lesions throughout the liver, suspicious for metastatic disease. 3. Irregular 7 x 6 mm and 6 x 5 mm left lower lobe nodules, suspicious for metastatic disease. 4.  Aortic Atherosclerosis (ICD10-I70.0). These results will be called to the ordering clinician or representative by the Radiologist Assistant, and communication documented in the PACS or [employer]. Electronically Signed   By: Limin  Xu M.D.   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  PLAN:    Pancreatic mass with liver lesions: Highly suspicious for stage IV pancreatic cancer.  CT scan results from December 25, 2023 reviewed independently and reported as above with a 3.6 x 2.5 cm pancreatic mass as well as multiple liver lesions largest measuring 4.6 x 4.2 cm.  Patient stated multiple times that she does not wish to pursue any treatment with chemotherapy, but has tentatively agreed to PET scan and biopsy to confirm diagnosis and stage.  Will get CA 19-9 today for completeness.  Return to clinic in approximately 2 weeks after her biopsy to discuss the results and continued discussion of pursuing treatment or possibly pursuing hospice.  Appreciate palliative care input. Poor appetite/nausea: Patient was given a prescription for dexamethasone  4 mg daily.  She also has Phenergan as needed. Pain: Patient was given a prescription for  tramadol today.  I spent a total of 60 minutes reviewing chart data, face-to-face evaluation with the patient, counseling and coordination of care as detailed above.   Patient expressed understanding and was in agreement with this plan. She also understands that She can call clinic at any time with any questions, concerns, or complaints.    Cancer Staging  Carcinoma of upper-outer quadrant of right breast in female, estrogen receptor positive (HCC) Staging form: Breast, AJCC 8th Edition - Pathologic stage from 02/09/2019: Stage Unknown (pT1a, pNX, cM0, G1, ER+, PR+, HER2-) - Signed by Izell Domino, MD on 02/10/2019 Stage prefix: Initial diagnosis Histologic grading system: 3 grade system - Clinical: No stage assigned - Unsigned   Evalene JINNY Reusing, MD   12/30/2023 11:03 AM

## 2023-12-30 NOTE — Progress Notes (Signed)
 Very weak, no appetite, chest pain that radiates through to her back.

## 2023-12-31 ENCOUNTER — Telehealth: Payer: Self-pay | Admitting: *Deleted

## 2023-12-31 LAB — CANCER ANTIGEN 19-9: CA 19-9: 9581 U/mL — ABNORMAL HIGH (ref 0–35)

## 2023-12-31 NOTE — Telephone Encounter (Signed)
 RN placed call to patient, unable to reach patient so RN spoke with daughter Rosaline. Appointment is scheduled for US  liver biopsy on Thursday 11/13 at 11:00 am, patient to arrive at 10:00 am. Patient will check in at the [facility] entrance. Patient will need to be NPO after midnight and will need driver to and from procedure. Patients daughter Rosaline verbalized understanding and is in agreement with plan.

## 2023-12-31 NOTE — Progress Notes (Signed)
 Sheri Shaw LABOR, MD sent to [facility] PROCEDURE / BIOPSY REVIEW Date: 12/30/23  Requested Biopsy site: liver Reason for request: liver masses Imaging review: Best seen on CT  Decision: Approved Imaging modality to perform: Ultrasound Schedule with: Moderate Sedation Schedule for: Any VIR  Additional comments: @Schedulers .  Please contact me with questions, concerns, or if issue pertaining to this request arise.  Shaw LABOR Jenna, MD [facility]

## 2024-01-06 ENCOUNTER — Other Ambulatory Visit: Payer: Self-pay | Admitting: *Deleted

## 2024-01-06 ENCOUNTER — Telehealth: Payer: Self-pay

## 2024-01-06 ENCOUNTER — Ambulatory Visit
Admission: RE | Admit: 2024-01-06 | Discharge: 2024-01-06 | Disposition: A | Discharge status: Home / Self Care | Source: Ambulatory Visit | Attending: Oncology | Admitting: Oncology

## 2024-01-06 DIAGNOSIS — I700 Atherosclerosis of aorta: Secondary | ICD-10-CM | POA: Insufficient documentation

## 2024-01-06 DIAGNOSIS — K5730 Diverticulosis of large intestine without perforation or abscess without bleeding: Secondary | ICD-10-CM | POA: Diagnosis not present

## 2024-01-06 DIAGNOSIS — R935 Abnormal findings on diagnostic imaging of other abdominal regions, including retroperitoneum: Secondary | ICD-10-CM | POA: Diagnosis not present

## 2024-01-06 DIAGNOSIS — R590 Localized enlarged lymph nodes: Secondary | ICD-10-CM | POA: Diagnosis not present

## 2024-01-06 DIAGNOSIS — Z853 Personal history of malignant neoplasm of breast: Secondary | ICD-10-CM | POA: Diagnosis not present

## 2024-01-06 DIAGNOSIS — K769 Liver disease, unspecified: Secondary | ICD-10-CM | POA: Diagnosis not present

## 2024-01-06 DIAGNOSIS — K8020 Calculus of gallbladder without cholecystitis without obstruction: Secondary | ICD-10-CM | POA: Diagnosis not present

## 2024-01-06 DIAGNOSIS — N281 Cyst of kidney, acquired: Secondary | ICD-10-CM | POA: Insufficient documentation

## 2024-01-06 DIAGNOSIS — R918 Other nonspecific abnormal finding of lung field: Secondary | ICD-10-CM | POA: Insufficient documentation

## 2024-01-06 DIAGNOSIS — K8689 Other specified diseases of pancreas: Secondary | ICD-10-CM | POA: Diagnosis present

## 2024-01-06 DIAGNOSIS — N750 Cyst of Bartholin's gland: Secondary | ICD-10-CM | POA: Insufficient documentation

## 2024-01-06 LAB — GLUCOSE, CAPILLARY: Glucose-Capillary: 89 mg/dL (ref 70–99)

## 2024-01-06 MED ORDER — FLUDEOXYGLUCOSE F - 18 (FDG) INJECTION
5.4000 | Freq: Once | INTRAVENOUS | Status: AC | PRN
  Administered 2024-01-06: 5.82 via INTRAVENOUS

## 2024-01-06 MED ORDER — ACETAMINOPHEN-CODEINE 300-30 MG PO TABS: 1.0000 | ORAL_TABLET | ORAL | 0 refills | Status: DC | PRN

## 2024-01-06 NOTE — Telephone Encounter (Signed)
 Patient requesting prescription for Tylenol  #3.  Was prescribed Tramadol at visit last week but not effective and has taken Tylenol  #3 with good response in the past.

## 2024-01-06 NOTE — Telephone Encounter (Addendum)
 Patient informed with Dr. Jacobo will send rx for Tylenol  #3 and NOT take Tramadol.

## 2024-01-07 ENCOUNTER — Other Ambulatory Visit: Payer: Self-pay | Admitting: Radiology

## 2024-01-07 DIAGNOSIS — K769 Liver disease, unspecified: Secondary | ICD-10-CM

## 2024-01-07 NOTE — Progress Notes (Signed)
 Patient for US  guided Liver Biopsy on Thurs 01/08/24, I called and spoke with the patient on the phone and gave pre-procedure instructions. Pt was made aware to be here at 10a, NPO after MN prior to procedure as well as driver post procedure/recovery/discharge. Pt stated understanding.  Called 01/07/24

## 2024-01-08 ENCOUNTER — Ambulatory Visit
Admission: RE | Admit: 2024-01-08 | Discharge: 2024-01-08 | Disposition: A | Discharge status: Home / Self Care | Source: Ambulatory Visit | Attending: Oncology | Admitting: Oncology

## 2024-01-08 ENCOUNTER — Other Ambulatory Visit: Payer: Self-pay

## 2024-01-08 DIAGNOSIS — Z853 Personal history of malignant neoplasm of breast: Secondary | ICD-10-CM | POA: Diagnosis not present

## 2024-01-08 DIAGNOSIS — C787 Secondary malignant neoplasm of liver and intrahepatic bile duct: Secondary | ICD-10-CM | POA: Insufficient documentation

## 2024-01-08 DIAGNOSIS — Z66 Do not resuscitate: Secondary | ICD-10-CM | POA: Insufficient documentation

## 2024-01-08 DIAGNOSIS — K769 Liver disease, unspecified: Secondary | ICD-10-CM

## 2024-01-08 DIAGNOSIS — K8689 Other specified diseases of pancreas: Secondary | ICD-10-CM | POA: Diagnosis present

## 2024-01-08 DIAGNOSIS — Z9221 Personal history of antineoplastic chemotherapy: Secondary | ICD-10-CM | POA: Insufficient documentation

## 2024-01-08 LAB — PROTIME-INR
INR: 1 (ref 0.8–1.2)
Prothrombin Time: 13.2 s (ref 11.4–15.2)

## 2024-01-08 LAB — CBC
HCT: 38 % (ref 36.0–46.0)
Hemoglobin: 12 g/dL (ref 12.0–15.0)
MCH: 27.2 pg (ref 26.0–34.0)
MCHC: 31.6 g/dL (ref 30.0–36.0)
MCV: 86.2 fL (ref 80.0–100.0)
Platelets: 373 K/uL (ref 150–400)
RBC: 4.41 MIL/uL (ref 3.87–5.11)
RDW: 12.6 % (ref 11.5–15.5)
WBC: 22.1 K/uL — ABNORMAL HIGH (ref 4.0–10.5)
nRBC: 0 % (ref 0.0–0.2)

## 2024-01-08 MED ORDER — SODIUM CHLORIDE 0.9 % IV SOLN: INTRAVENOUS | Status: DC

## 2024-01-08 MED ORDER — FENTANYL CITRATE (PF) 100 MCG/2ML IJ SOLN
INTRAMUSCULAR | Status: AC
  Filled 2024-01-08: qty 2

## 2024-01-08 MED ORDER — FENTANYL CITRATE (PF) 100 MCG/2ML IJ SOLN
INTRAMUSCULAR | Status: AC | PRN
  Administered 2024-01-08 (×2): 50 ug via INTRAVENOUS

## 2024-01-08 MED ORDER — MIDAZOLAM HCL (PF) 2 MG/2ML IJ SOLN
INTRAMUSCULAR | Status: AC | PRN
Start: 2024-01-08 — End: 2024-01-08
  Administered 2024-01-08: 1 mg via INTRAVENOUS

## 2024-01-08 MED ORDER — LIDOCAINE HCL (PF) 1 % IJ SOLN
10.0000 mL | Freq: Once | INTRAMUSCULAR | Status: AC
  Administered 2024-01-08: 10 mL
  Filled 2024-01-08: qty 10

## 2024-01-08 MED ORDER — MIDAZOLAM HCL 2 MG/2ML IJ SOLN
INTRAMUSCULAR | Status: AC
  Filled 2024-01-08: qty 2

## 2024-01-08 NOTE — Procedures (Signed)
Interventional Radiology Procedure Note  Procedure: US Guided Biopsy of left lobe liver lesion  Complications: None  Estimated Blood Loss: < 10 mL  Findings: 18 G core biopsy of 3 cm left lobe liver lesion performed under US guidance.  Three core samples obtained and sent to Pathology.  Venetia Night. Kathlene Cote, M.D Pager:  [phone]

## 2024-01-08 NOTE — Progress Notes (Signed)
 Patient has remained clinically stable post liver biopsy, discharge instructions given with questions answered. Denies complaints.

## 2024-01-08 NOTE — H&P (Signed)
 Chief Complaint:  Pancreatic mass with liver lesions  Procedure: Liver lesion biopsy   Referring Provider(s): Dr. Evalene Reusing  Supervising Physician: Luverne Aran  Patient Status: [facility] - Out-pt  History of Present Illness: Sheri Shaw is a 83 y.o. female with a history of breast cancer s/p lumpectomy and chemotherapy who had presented to her Internist in late October with complaints of progressively worsening lower abdominal discomfort, decreased appetite, and bloating. Also reported weight loss at that time. CT A/P completed on 10/30 which revealed an ill-defined pancreatic mass, suspicious for adenocarcinoma as well as multiple enhancing lesions throughout the liver and left lower lobe nodules concerning for metastases. She was subsequently referred to Oncology who recommend liver lesion biopsy. Per notes, patient is agreeable to biopsy, but does not wish to pursue any chemotherapy/treatment at this time.  She presents today with her granddaughter for her liver lesion biopsy. Patient admits to continued abdominal pain that radiates to her back and chest, as well as decreased appetite, constipation, and some weight loss. Also reports intermittent fevers/chills and nausea, but denies any night sweats, shortness of breath, or blood in her stool. NPO since midnight. All questions and concerns answered at the bedside.   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  Medications: Prior to Admission medications   Medication Sig Start Date End Date Taking? Authorizing Provider  acetaminophen -codeine  (TYLENOL  #3) 300-30 MG tablet Take 1 tablet by mouth every 4 (four) hours as needed for moderate pain (pain score 4-6). 01/06/24   Reusing Evalene PARAS, MD  dexamethasone  (DECADRON ) 4 MG tablet Take 1 tablet (4 mg total) by mouth daily. 12/30/23   Finnegan, Timothy J, MD  diazepam (VALIUM) 5 MG tablet Take 5 mg by mouth at bedtime as needed for anxiety. She takes 2.5mg  nightly    [provider]  levothyroxine (SYNTHROID) 25 MCG tablet Take 25 mcg by mouth daily. 08/18/21   [provider]  prochlorperazine (COMPAZINE) 10 MG tablet Take 10 mg by mouth. Patient not taking: Reported on 12/30/2023 12/29/23   [provider]  traMADol (ULTRAM) 50 MG tablet Take 1 tablet (50 mg total) by mouth every 8 (eight) hours as needed. 12/30/23   Finnegan, Timothy J, MD  Vitamin D, Cholecalciferol, 50 MCG (2000 UT) CAPS Take by mouth.    [provider]     Family History  Problem Relation Age of Onset   Breast cancer Maternal Aunt  dx. in her 9s   Lung cancer Maternal Uncle        dx. in his 10s   COPD Mother    Heart Problems Maternal Grandmother    Pneumonia Maternal Grandfather    Cancer Maternal Aunt        primary peritoneal cancer, dx. in her 13s   Heart attack Maternal Aunt 51   Hodgkin's lymphoma Maternal Uncle 36   Breast cancer Cousin 46       maternal cousin   Esophageal cancer Cousin 15        maternal cousin   Lymphoma Cousin        maternal cousin    Social History   Socioeconomic History   Marital status: Married    Spouse name: Not on file   Number of children: Not on file   Years of education: Not on file   Highest education level: Not on file  Occupational History   Not on file  Tobacco Use   Smoking status: Never    Passive exposure: Never   Smokeless tobacco: Never  Vaping Use   Vaping status: Never Used  Substance and Sexual Activity   Alcohol use: Never   Drug use: Never   Sexual activity: Not on file  Other Topics Concern   Not on file  Social History Narrative   Not on file   Social Drivers of Health   Financial Resource Strain: Low Risk  (04/30/2023)   Received from [facility]   Overall Financial Resource Strain (CARDIA)    Difficulty of Paying Living Expenses: Not hard at all  Food Insecurity: No Food Insecurity (04/30/2023)   Received from [facility]   Hunger Vital Sign    Within the past 12 months, you worried that your food would run out before you got the money to buy more.: Never true    Within the past 12 months, the food you bought just didn't last and you didn't have money to get more.: Never true  Transportation Needs: No Transportation Needs (04/30/2023)   Received from [facility] - Transportation    In the past 12 months, has lack of transportation kept you from medical appointments or from getting medications?: No    Lack of Transportation (Non-Medical): No  Physical Activity: Not on file  Stress: Not on file  Social Connections: Not on file    Review of Systems  Constitutional:  Positive for fatigue, fever and unexpected weight change.  Gastrointestinal:  Positive for abdominal pain and nausea.  Musculoskeletal:  Positive for back pain.  Patient denies any headache, chest pain, shortness of breath, vomiting, or blood in stool. All other systems are negative.    Vital Signs: BP (!) 179/73   Pulse 63   Temp 97.8 F (36.6 C) (Oral)   Resp 14   SpO2 92%   Advance Care Plan: The advanced care plan/surrogate decision maker was discussed at the time of visit and documented in the medical record.    Physical Exam Vitals reviewed.  Constitutional:      Comments: Appears uncomfortable  HENT:     Mouth/Throat:     Mouth: Mucous membranes are moist.     Pharynx: Oropharynx is clear.  Cardiovascular:     Rate and Rhythm: Normal rate and regular rhythm.  Pulmonary:     Effort: Pulmonary effort is normal.  Abdominal:     General: Abdomen is flat.  Palpations: Abdomen is soft.     Tenderness: There is abdominal tenderness (throughout).  Skin:    General: Skin is warm and dry.  Neurological:     Mental Status: She is alert and oriented to person, place, and time.  Psychiatric:        Behavior: Behavior normal.     Imaging: NM PET Image Initial (PI) Skull Base To Thigh (F-18 FDG) Result Date: 01/07/2024 CLINICAL DATA:  Initial treatment strategy for pancreatic mass. History of breast cancer status post right lumpectomy 2020 EXAM: NUCLEAR MEDICINE PET SKULL BASE TO THIGH TECHNIQUE: 5.82 mCi F-18 FDG was injected intravenously. Full-ring PET imaging was performed from the skull base to thigh after the radiotracer. CT data was obtained and used for attenuation correction and anatomic localization. Fasting blood glucose: 89 mg/dl COMPARISON:  CT abdomen pelvis December 25, 2023. FINDINGS: Mediastinal blood pool activity: SUV max 1.8 Liver activity: SUV max 2.3 NECK: No hypermetabolic lymphadenopathy. CHEST: Numerous hypermetabolic pulmonary nodules consistent with metastatic disease. Index node measures 11 mm in left perifissural (6/56). No pleural effusion. Multiple subcentimeter and pericentimeter mediastinal lymph nodes without significant uptake. Atherosclerotic calcifications of coronary arteries. The heart size is normal. No hypermetabolic new  breast lesion or axillary lymphadenopathy. Postlumpectomy changes of right breast without suspicious uptake. ABDOMEN/PELVIS: FDG avid pancreatic distal body/tail mass with central calcification max SUV 12.6 measuring approximately 4.2 x 3.3 cm. No significant peripancreatic lymphadenopathy. Multiple hypermetabolic metastatic lesions with central necrosis and photopenia throughout the liver measuring up to 4 cm max SUV up to 18.3. Focal activity adjacent to right greater trochanter suggestive of bursitis. Incidental CT findings: Colonic diverticulosis. Cholelithiasis. Non FDG avid dense lesion measuring 1.6 x 1.3 cm along the right vaginal introitus most consistent with a complex Bartholin's cyst. Subcentimeter left kidney cortical cyst without FDG uptake on current exam. Limited evaluation of vascular structures on current noncontrast attenuation correction CT. SKELETON: No suspicious lytic or sclerotic osseous. Incidental CT findings: None. IMPRESSION: Pancreatic distal body/tail mass with central necrosis and calcified nidus consistent with malignancy (primary adenocarcinoma versus metastatic from known breast cancer). Multiple necrotic hypermetabolic metastatic liver lesions. Recommend tissue sampling. Multiple FDG avid pulmonary nodules consistent with metastasis. Follow-up according to oncologic protocols. No hypermetabolic breast lesion to suggest locally recurrent malignancy or suspicious axillary lymphadenopathy. Findings are stable to recent CT. Please refer to CT with contrast for further details of vascular invasion. Electronically Signed   By: Megan  Zare M.D.   On: 01/07/2024 12:17   CT ABDOMEN PELVIS W WO CONTRAST Result Date: 12/25/2023 CLINICAL DATA:  Generalized abdominal pain and nausea. * Tracking Code: BO * EXAM: CT ABDOMEN AND PELVIS WITHOUT AND WITH CONTRAST TECHNIQUE: Multidetector CT imaging of the abdomen and pelvis was performed following the standard protocol before and following the  bolus administration of intravenous contrast. RADIATION DOSE REDUCTION: This exam was performed according to the departmental dose-optimization program which includes automated exposure control, adjustment of the mA and/or kV according to patient size and/or use of iterative reconstruction technique. CONTRAST:  100mL OMNIPAQUE IOHEXOL 300 MG/ML  SOLN COMPARISON:  None Available. FINDINGS: Lower chest: Subsegmental lingular and right middle lobe atelectasis. Irregular 7 x 6 mm and 6 x 5 mm left lower lobe nodules (4:4). No pleural effusion or pneumothorax demonstrated. Partially imaged heart size is normal. Hepatobiliary: Multifocal peripherally enhancing lesions throughout the liver, for example 4.6 x 4.2 cm segment 7/8 (7:12), 2.7 x 2.0 cm segment 2/3 (7:18), and 3.7 x 3.5 cm segment 5/6 (  7:27). No intra or extrahepatic biliary ductal dilation. Normal gallbladder. Pancreas: Ill-defined, expansile mass within the truncated pancreatic body/tail measures 3.6 x 2.5 cm (7:16). There is encasement of the proximal celiac artery and abutment along the anterior aspect of the superior mesenteric artery (7:12). Complete effacement of the superior mesenteric vein at the junction with portal vein. Spleen: Normal in size without focal abnormality. Adrenals/Urinary Tract: No adrenal nodules. No hydronephrosis or calculi. Bilateral subcentimeter hypodensities, too small to characterize but likely cysts. No focal bladder wall thickening. Stomach/Bowel: Normal appearance of the stomach. No evidence of bowel wall thickening, distention, or inflammatory changes. Moderate volume stool throughout the colon. Colonic diverticulosis without acute diverticulitis. Appendix is not discretely seen. Vascular/Lymphatic: Aortic atherosclerosis. 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 No enlarged abdominal or pelvic lymph nodes. Reproductive: No adnexal masses. Other: No free fluid, fluid collection, or free air. Musculoskeletal: No acute or abnormal  lytic or blastic osseous lesions. IMPRESSION: 1. Ill-defined pancreatic mass, suspicious for adenocarcinoma. Vascular involvement as described. 2. Multifocal peripherally enhancing lesions throughout the liver, suspicious for metastatic disease. 3. Irregular 7 x 6 mm and 6 x 5 mm left lower lobe nodules, suspicious for metastatic disease. 4.  Aortic Atherosclerosis (ICD10-I70.0). These results will be called to the ordering clinician or representative by the Radiologist Assistant, and communication documented in the PACS or [employer]. Electronically Signed   By: Limin  Xu M.D.   On: 12/25/2023 14:37    Labs:  CBC: Recent Labs    01/08/24 1119  WBC 22.1*  HGB 12.0  HCT 38.0  PLT 373    COAGS: Recent Labs    01/08/24 1119  INR 1.0    BMP: No results for input(s): NA, K, CL, CO2, GLUCOSE, BUN, CALCIUM, CREATININE, GFRNONAA, GFRAA in the last 8760 hours.  Invalid input(s): CMP  LIVER FUNCTION TESTS: No results for input(s): BILITOT, AST, ALT, ALKPHOS, PROT, ALBUMIN in the last 8760 hours.  TUMOR MARKERS: No results for input(s): AFPTM, CEA, CA199, CHROMGRNA in the last 8760 hours.  Assessment and Plan:  Pancreatic mass with liver lesions: CHAVONNE SFORZA is a 83 y.o. female with a history of breast cancer and recent imaging concerning for metastatic pancreatic cancer who presents to [facility] Interventional Radiology department for an image-guided liver lesion biopsy with Dr. KANDICE Moan. Procedure to be performed under moderate sedation.  Risks and benefits of liver lesion biopsy was discussed with the patient and/or patient's family including, but not limited to bleeding, infection, damage to adjacent structures or low yield requiring additional tests.  All of the questions were answered and there is agreement to proceed.  Consent signed and in chart.   Thank you for allowing our service to participate in MAKENLY LARABEE 's  care.    Electronically Signed: Glennon CHRISTELLA Bal, PA-C   01/08/2024, 12:01 PM     I spent a total of  30 Minutesin face to face in clinical consultation, greater than 50% of which was counseling/coordinating care for liver lesion biopsy.

## 2024-01-08 NOTE — Progress Notes (Signed)
 Patient clinically stable post US  Liver biopsy per Dr Luverne, tolerated well. Vitals stable pre and post procedure. Received Versed  1 mg along with Fentanyl  100 mcg IV for procedure. Denies complaints immediately post procedure. Report given to Josette Peak post procedure/specials/17

## 2024-01-11 ENCOUNTER — Encounter: Payer: Self-pay | Admitting: Oncology

## 2024-01-12 ENCOUNTER — Telehealth: Payer: Self-pay | Admitting: *Deleted

## 2024-01-12 NOTE — Telephone Encounter (Signed)
 Spoke with patient re: her c/o of constipation and nausea. Since sending her mychart msg yesterday, she just had several bms this morning. She used miralax and ducolax. She reports improvement in  nausea with use of compazine. Pt instructed to continue bowel regimen as needed and to take compazine every 6 hours as needed. She declines smc apts today since symptoms are improving. Pt instructed that if symptoms worsen to give us  a call back.

## 2024-01-13 LAB — SURGICAL PATHOLOGY

## 2024-01-14 ENCOUNTER — Encounter: Payer: Self-pay | Admitting: Oncology

## 2024-01-14 ENCOUNTER — Inpatient Hospital Stay (HOSPITAL_BASED_OUTPATIENT_CLINIC_OR_DEPARTMENT_OTHER): Admitting: Oncology

## 2024-01-14 VITALS — BP 144/60 | HR 72 | Temp 98.0°F | Resp 16 | Wt 106.0 lb

## 2024-01-14 DIAGNOSIS — C259 Malignant neoplasm of pancreas, unspecified: Secondary | ICD-10-CM

## 2024-01-14 MED ORDER — ONDANSETRON HCL 8 MG PO TABS: 8.0000 mg | ORAL_TABLET | Freq: Three times a day (TID) | ORAL | 3 refills | Status: DC | PRN

## 2024-01-14 NOTE — Progress Notes (Unsigned)
 [facility]  [phone] [phone]  ID: Sheri Shaw OB: August 30, 1940  MR#: [id number]  RDW#:[id number]  Patient Care Team: Fernande Ophelia JINNY DOUGLAS, MD as PCP - General (Internal Medicine) Tyree Nanetta SAILOR, RN as Oncology Nurse Navigator Izell Domino, MD as Consulting Physician (Radiation Oncology) Ebbie Cough, MD as Consulting Physician (General Surgery) Maurie Rayfield BIRCH, RN as Oncology Nurse Navigator  CHIEF COMPLAINT: Stage IV pancreatic cancer.  INTERVAL HISTORY: Patient returns to clinic today for further evaluation and discussion of her imaging and pathology results.  She continues to have nausea and a poor appetite.  She has chronic weakness and fatigue.  She continues to have abdominal pain.  She has no neurologic complaints.  She denies any recent fevers or illnesses.  She has no chest pain, shortness of breath, cough, or hemoptysis.  She denies any vomiting, constipation, or diarrhea. She has no urinary complaints.  Patient offers no further specific complaints today.  REVIEW OF SYSTEMS:   Review of Systems  Constitutional:  Positive for malaise/fatigue. Negative for fever and weight loss.  Respiratory: Negative.  Negative for cough, hemoptysis and shortness of breath.   Cardiovascular: Negative.  Negative for chest pain and leg swelling.  Gastrointestinal:  Positive for abdominal pain and nausea. Negative for blood in stool and melena.  Genitourinary: Negative.  Negative for dysuria.  Musculoskeletal: Negative.  Negative for back pain.  Skin: Negative.  Negative for rash.  Neurological:  Positive for weakness. Negative for dizziness and speech change.  Psychiatric/Behavioral: Negative.  The patient is not nervous/anxious.     As per HPI. Otherwise, a complete review of systems is negative.  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 in her 58s   Lung cancer Maternal Uncle        dx. in his 67s   COPD Mother    Heart Problems Maternal Grandmother    Pneumonia Maternal Grandfather    Cancer Maternal Aunt        primary peritoneal cancer, dx. in her 36s   Heart attack Maternal Aunt 51   Hodgkin's lymphoma Maternal Uncle 36   Breast cancer Cousin 16       maternal cousin   Esophageal cancer Cousin 81       maternal cousin   Lymphoma Cousin        maternal cousin    ADVANCED DIRECTIVES (  Y/N):  N  HEALTH MAINTENANCE: Social History   Tobacco Use   Smoking status: Never    Passive exposure: Never   Smokeless tobacco: Never  Vaping Use   Vaping status: Never Used  Substance Use Topics   Alcohol use: Never   Drug use: Never     Colonoscopy:  PAP:  Bone density:  Lipid panel:  No Known Allergies  Current Outpatient Medications  Medication Sig Dispense Refill   ondansetron  (ZOFRAN ) 8 MG tablet Take 1 tablet (8 mg total) by mouth every 8 (eight) hours as needed for nausea or  vomiting. 30 tablet 3   acetaminophen -codeine  (TYLENOL  #3) 300-30 MG tablet Take 1 tablet by mouth every 4 (four) hours as needed for moderate pain (pain score 4-6). 60 tablet 0   dexamethasone  (DECADRON ) 4 MG tablet Take 1 tablet (4 mg total) by mouth daily. 30 tablet 0   diazepam (VALIUM) 5 MG tablet Take 5 mg by mouth at bedtime as needed for anxiety. She takes 2.5mg  nightly     levothyroxine (SYNTHROID) 25 MCG tablet Take 25 mcg by mouth daily.     prochlorperazine (COMPAZINE) 10 MG tablet Take 10 mg by mouth. (Patient not taking: Reported on 12/30/2023)     traMADol  (ULTRAM ) 50 MG tablet Take 1 tablet (50 mg total) by mouth every 8 (eight) hours as needed. 60 tablet 0   Vitamin D, Cholecalciferol, 50 MCG (2000 UT) CAPS Take by mouth.     No current facility-administered medications for this visit.    OBJECTIVE: Vitals:   01/14/24 1025  BP: (!) 144/60  Pulse: 72  Resp: 16  Temp: 98 F (36.7 C)  SpO2: 100%     Body mass index is 16.6 kg/m.    ECOG FS:1 - Symptomatic but completely ambulatory  General: Well-developed, well-nourished, no acute distress. Eyes: Pink conjunctiva, anicteric sclera. HEENT: Normocephalic, moist mucous membranes. Lungs: No audible wheezing or coughing. Heart: Regular rate and rhythm. Abdomen: Soft, nontender, no obvious distention. Musculoskeletal: No edema, cyanosis, or clubbing. Neuro: Alert, answering all questions appropriately. Cranial nerves grossly intact. Skin: No rashes or petechiae noted. Psych: Normal affect.  LAB RESULTS:  Lab Results  Component Value Date   NA 132 (L) 04/13/2020   K 4.5 04/13/2020   CL 100 04/13/2020   CO2 27 04/13/2020   GLUCOSE 83 04/13/2020   BUN 14 04/13/2020   CREATININE 0.77 04/13/2020   CALCIUM 9.8 04/13/2020   PROT 6.7 04/13/2020   ALBUMIN 3.8 04/13/2020   AST 15 04/13/2020   ALT 9 04/13/2020   ALKPHOS 45 04/13/2020   BILITOT 0.3 04/13/2020   GFRNONAA >60 04/13/2020   GFRAA >60 03/09/2019    Lab  Results  Component Value Date   WBC 22.1 (H) 01/08/2024   NEUTROABS 3.5 04/13/2020   HGB 12.0 01/08/2024   HCT 38.0 01/08/2024   MCV 86.2 01/08/2024   PLT 373 01/08/2024     STUDIES: US  BIOPSY (LIVER) Result Date: 01/08/2024 INDICATION: Pancreatic mass and multiple liver lesions. EXAM: ULTRASOUND GUIDED CORE BIOPSY OF LIVER MASS MEDICATIONS: None. ANESTHESIA/SEDATION: Moderate (conscious) sedation was employed during this procedure. A total of Versed  1.0 mg and Fentanyl  100 mcg was administered intravenously. Moderate Sedation Time: 10 minutes. The patient's level of consciousness and vital signs were monitored continuously by radiology nursing throughout the procedure under my direct supervision. PROCEDURE: The procedure, risks, benefits, and alternatives were explained to the patient. Questions regarding the procedure were encouraged and answered. The patient understands and consents  to the procedure. A time out was performed prior to initiating the procedure. The abdominal wall was prepped with chlorhexidine in a sterile fashion, and a sterile drape was applied covering the operative field. A sterile gown and sterile gloves were used for the procedure. Local anesthesia was provided with 1% Lidocaine . Ultrasound was used to localize liver lesions. Under ultrasound guidance, a 17 gauge trocar needle was advanced into the left lobe of the liver. After confirming needle tip position, 3 separate coaxial 18 gauge core biopsy samples were obtained and submitted in formalin. A slurry of Gel-Foam EmboCubes mixed in sterile saline was then injected via the outer needle as the needle was retracted and removed. Additional ultrasound was performed. COMPLICATIONS: None immediate. FINDINGS: Multiple heterogeneous masses were seen throughout the liver parenchyma. A lesion in the left lobe was chosen for biopsy measuring up to approximately 3 cm in diameter. Solid tissue was obtained. IMPRESSION: Ultrasound-guided  core biopsy performed of a left lobe liver mass measuring roughly 3 cm in diameter. Electronically Signed   By: Marcey Moan M.D.   On: 01/08/2024 13:37   NM PET Image Initial (PI) Skull Base To Thigh (F-18 FDG) Result Date: 01/07/2024 CLINICAL DATA:  Initial treatment strategy for pancreatic mass. History of breast cancer status post right lumpectomy 2020 EXAM: NUCLEAR MEDICINE PET SKULL BASE TO THIGH TECHNIQUE: 5.82 mCi F-18 FDG was injected intravenously. Full-ring PET imaging was performed from the skull base to thigh after the radiotracer. CT data was obtained and used for attenuation correction and anatomic localization. Fasting blood glucose: 89 mg/dl COMPARISON:  CT abdomen pelvis December 25, 2023. FINDINGS: Mediastinal blood pool activity: SUV max 1.8 Liver activity: SUV max 2.3 NECK: No hypermetabolic lymphadenopathy. CHEST: Numerous hypermetabolic pulmonary nodules consistent with metastatic disease. Index node measures 11 mm in left perifissural (6/56). No pleural effusion. Multiple subcentimeter and pericentimeter mediastinal lymph nodes without significant uptake. Atherosclerotic calcifications of coronary arteries. The heart size is normal. No hypermetabolic new breast lesion or axillary lymphadenopathy. Postlumpectomy changes of right breast without suspicious uptake. ABDOMEN/PELVIS: FDG avid pancreatic distal body/tail mass with central calcification max SUV 12.6 measuring approximately 4.2 x 3.3 cm. No significant peripancreatic lymphadenopathy. Multiple hypermetabolic metastatic lesions with central necrosis and photopenia throughout the liver measuring up to 4 cm max SUV up to 18.3. Focal activity adjacent to right greater trochanter suggestive of bursitis. Incidental CT findings: Colonic diverticulosis. Cholelithiasis. Non FDG avid dense lesion measuring 1.6 x 1.3 cm along the right vaginal introitus most consistent with a complex Bartholin's cyst. Subcentimeter left kidney cortical cyst  without FDG uptake on current exam. Limited evaluation of vascular structures on current noncontrast attenuation correction CT. SKELETON: No suspicious lytic or sclerotic osseous. Incidental CT findings: None. IMPRESSION: Pancreatic distal body/tail mass with central necrosis and calcified nidus consistent with malignancy (primary adenocarcinoma versus metastatic from known breast cancer). Multiple necrotic hypermetabolic metastatic liver lesions. Recommend tissue sampling. Multiple FDG avid pulmonary nodules consistent with metastasis. Follow-up according to oncologic protocols. No hypermetabolic breast lesion to suggest locally recurrent malignancy or suspicious axillary lymphadenopathy. Findings are stable to recent CT. Please refer to CT with contrast for further details of vascular invasion. Electronically Signed   By: Megan  Zare M.D.   On: 01/07/2024 12:17   CT ABDOMEN PELVIS W WO CONTRAST Result Date: 12/25/2023 CLINICAL DATA:  Generalized abdominal pain and nausea. * Tracking Code: BO * EXAM: CT ABDOMEN AND PELVIS WITHOUT AND WITH CONTRAST TECHNIQUE: Multidetector CT imaging of the abdomen and pelvis  was performed following the standard protocol before and following the bolus administration of intravenous contrast. RADIATION DOSE REDUCTION: This exam was performed according to the departmental dose-optimization program which includes automated exposure control, adjustment of the mA and/or kV according to patient size and/or use of iterative reconstruction technique. CONTRAST:  100mL OMNIPAQUE IOHEXOL 300 MG/ML  SOLN COMPARISON:  None Available. FINDINGS: Lower chest: Subsegmental lingular and right middle lobe atelectasis. Irregular 7 x 6 mm and 6 x 5 mm left lower lobe nodules (4:4). No pleural effusion or pneumothorax demonstrated. Partially imaged heart size is normal. Hepatobiliary: Multifocal peripherally enhancing lesions throughout the liver, for example 4.6 x 4.2 cm segment 7/8 (7:12), 2.7 x 2.0  cm segment 2/3 (7:18), and 3.7 x 3.5 cm segment 5/6 (7:27). No intra or extrahepatic biliary ductal dilation. Normal gallbladder. Pancreas: Ill-defined, expansile mass within the truncated pancreatic body/tail measures 3.6 x 2.5 cm (7:16). There is encasement of the proximal celiac artery and abutment along the anterior aspect of the superior mesenteric artery (7:12). Complete effacement of the superior mesenteric vein at the junction with portal vein. Spleen: Normal in size without focal abnormality. Adrenals/Urinary Tract: No adrenal nodules. No hydronephrosis or calculi. Bilateral subcentimeter hypodensities, too small to characterize but likely cysts. No focal bladder wall thickening. Stomach/Bowel: Normal appearance of the stomach. No evidence of bowel wall thickening, distention, or inflammatory changes. Moderate volume stool throughout the colon. Colonic diverticulosis without acute diverticulitis. Appendix is not discretely seen. Vascular/Lymphatic: Aortic atherosclerosis. 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 No enlarged abdominal or pelvic lymph nodes. Reproductive: No adnexal masses. Other: No free fluid, fluid collection, or free air. Musculoskeletal: No acute or abnormal lytic or blastic osseous lesions. IMPRESSION: 1. Ill-defined pancreatic mass, suspicious for adenocarcinoma. Vascular involvement as described. 2. Multifocal peripherally enhancing lesions throughout the liver, suspicious for metastatic disease. 3. Irregular 7 x 6 mm and 6 x 5 mm left lower lobe nodules, suspicious for metastatic disease. 4.  Aortic Atherosclerosis (ICD10-I70.0). These results will be called to the ordering clinician or representative by the Radiologist Assistant, and communication documented in the PACS or [employer]. Electronically Signed   By: Limin  Xu M.D.   On: 12/25/2023 14:37    ASSESSMENT: Stage IV pancreatic cancer.  PLAN:    For pancreatic cancer: CT scan results from December 25, 2023 reviewed  independently with a 3.6 x 2.5 cm pancreatic mass as well as multiple liver lesions largest measuring 4.6 x 4.2 cm.  Biopsy of the liver on January 08, 2024 confirmed adenocarcinoma likely of pancreas biliary origin.  PET scan results from January 06, 2024 revealed hypermetabolic pancreatic mass as well as multiple liver and lung lesions consistent with metastasis.  CA 19-9 is 9581.  Patient continues to express a desire for quality of life over quantity and does not wish to pursue chemotherapy at this time.  She will discuss it further with her family.  We also discussed hospice and end-of-life care, but patient is not ready to enroll.  No intervention is needed at this time.  No follow-up has been scheduled, but patient or family will call clinic if she wishes to discuss options further.  Poor appetite/nausea: Continue dexamethasone  4 mg daily.  She has been instructed to take Zofran  during the day and then Compazine at night. Pain: Patient states Tylenol  3 is adequate for now.  She also has a prescription for tramadol. Leukocytosis: Likely reactive.  Patient expressed understanding and was in agreement with this plan. She also understands that  She can call clinic at any time with any questions, concerns, or complaints.    Cancer Staging  Carcinoma of upper-outer quadrant of right breast in female, estrogen receptor positive (HCC) Staging form: Breast, AJCC 8th Edition - Pathologic stage from 02/09/2019: Stage Unknown (pT1a, pNX, cM0, G1, ER+, PR+, HER2-) - Signed by Izell Domino, MD on 02/10/2019 Stage prefix: Initial diagnosis Histologic grading system: 3 grade system - Clinical: No stage assigned - Unsigned  Pancreatic adenocarcinoma [facility]) Staging form: Exocrine Pancreas, AJCC 8th Edition - Clinical: Stage IV (cT2, cN0, pM1) - Signed by Jacobo Evalene PARAS, MD on 01/15/2024 Total positive nodes: 0   Evalene PARAS Jacobo, MD   01/15/2024 1:16 PM

## 2024-01-14 NOTE — Progress Notes (Unsigned)
 Patient is feeling very weak, and off balance, she also has nausea, Compazine and Zofran  didn't seem to help her with the nausea.  Radiating pain from her back to her chest and abdomen, rates her pain at about a 5, ongoing since starting coming here. Abdomen is extended/swollen.  No appetite. Her daughter  would like to have some genetic testing.

## 2024-01-15 DIAGNOSIS — C259 Malignant neoplasm of pancreas, unspecified: Secondary | ICD-10-CM | POA: Insufficient documentation

## 2024-01-18 ENCOUNTER — Encounter: Payer: Self-pay | Admitting: Oncology

## 2024-01-19 ENCOUNTER — Encounter: Payer: Self-pay | Admitting: Hospice and Palliative Medicine

## 2024-01-19 ENCOUNTER — Other Ambulatory Visit: Payer: Self-pay | Admitting: Hospice and Palliative Medicine

## 2024-01-19 DIAGNOSIS — C259 Malignant neoplasm of pancreas, unspecified: Secondary | ICD-10-CM

## 2024-01-19 MED ORDER — OLANZAPINE 10 MG PO TABS: 5.0000 mg | ORAL_TABLET | Freq: Every evening | ORAL | 0 refills | Status: DC | PRN

## 2024-01-19 NOTE — Telephone Encounter (Signed)
 RN spoke with patient's son. He would like Home Health and nursing support w/in the home. Pt not ready for hospice services at this time. Josh, NP will reach out to patient's son to further discuss HH and pt's pain concerns.

## 2024-01-19 NOTE — Progress Notes (Signed)
 Spoke with patient's son, Dr. Arloa.  He reports that patient is fatigued and weak.  Appetite is poor.  She is having regular episodes of nausea despite taking antiemetics.  Will rotate from prochlorperazine to olanzapine , as this might also help nausea and appetite.  Son confirms goal is to keep patient home and comfortable until end-of-life.  He is in agreement with hospice involvement.  Will send referral to [facility].

## 2024-02-26 DEATH — deceased
